# Patient Record
Sex: Male | Born: 1965 | Race: White | Hispanic: No | Marital: Single | Smoking: Former smoker
Health system: Southern US, Community
[De-identification: ages and names within clinical notes are randomized; demographics above are authoritative.]

## PROBLEM LIST (undated history)

## (undated) DIAGNOSIS — I10 Essential (primary) hypertension: Secondary | ICD-10-CM

## (undated) DIAGNOSIS — N200 Calculus of kidney: Secondary | ICD-10-CM

## (undated) DIAGNOSIS — K219 Gastro-esophageal reflux disease without esophagitis: Secondary | ICD-10-CM

## (undated) DIAGNOSIS — R911 Solitary pulmonary nodule: Secondary | ICD-10-CM

## (undated) HISTORY — DX: Essential (primary) hypertension: I10

## (undated) HISTORY — DX: Gastro-esophageal reflux disease without esophagitis: K21.9

---

## 2015-03-16 ENCOUNTER — Encounter (HOSPITAL_BASED_OUTPATIENT_CLINIC_OR_DEPARTMENT_OTHER): Payer: Self-pay | Admitting: *Deleted

## 2015-03-16 ENCOUNTER — Emergency Department (HOSPITAL_BASED_OUTPATIENT_CLINIC_OR_DEPARTMENT_OTHER): Payer: No Typology Code available for payment source

## 2015-03-16 ENCOUNTER — Emergency Department (HOSPITAL_BASED_OUTPATIENT_CLINIC_OR_DEPARTMENT_OTHER)
Admission: EM | Admit: 2015-03-16 | Discharge: 2015-03-16 | Disposition: A | Discharge status: Home / Self Care | Payer: No Typology Code available for payment source | Attending: Emergency Medicine | Admitting: Emergency Medicine

## 2015-03-16 DIAGNOSIS — R109 Unspecified abdominal pain: Secondary | ICD-10-CM | POA: Diagnosis present

## 2015-03-16 DIAGNOSIS — Z87442 Personal history of urinary calculi: Secondary | ICD-10-CM | POA: Insufficient documentation

## 2015-03-16 DIAGNOSIS — Z87891 Personal history of nicotine dependence: Secondary | ICD-10-CM | POA: Insufficient documentation

## 2015-03-16 LAB — URINE MICROSCOPIC-ADD ON: WBC, UA: NONE SEEN WBC/hpf (ref 0–5)

## 2015-03-16 LAB — CBC WITH DIFFERENTIAL/PLATELET
BASOS ABS: 0 10*3/uL (ref 0.0–0.1)
Basophils Relative: 0 %
EOS ABS: 0.1 10*3/uL (ref 0.0–0.7)
EOS PCT: 1 %
HCT: 45.9 % (ref 39.0–52.0)
Hemoglobin: 15.2 g/dL (ref 13.0–17.0)
LYMPHS ABS: 2.1 10*3/uL (ref 0.7–4.0)
LYMPHS PCT: 25 %
MCH: 28.5 pg (ref 26.0–34.0)
MCHC: 33.1 g/dL (ref 30.0–36.0)
MCV: 86 fL (ref 78.0–100.0)
MONO ABS: 0.8 10*3/uL (ref 0.1–1.0)
Monocytes Relative: 9 %
Neutro Abs: 5.4 10*3/uL (ref 1.7–7.7)
Neutrophils Relative %: 65 %
PLATELETS: 226 10*3/uL (ref 150–400)
RBC: 5.34 MIL/uL (ref 4.22–5.81)
RDW: 13.1 % (ref 11.5–15.5)
WBC: 8.5 10*3/uL (ref 4.0–10.5)

## 2015-03-16 LAB — BASIC METABOLIC PANEL
Anion gap: 6 (ref 5–15)
BUN: 15 mg/dL (ref 6–20)
CO2: 25 mmol/L (ref 22–32)
CREATININE: 0.9 mg/dL (ref 0.61–1.24)
Calcium: 8.8 mg/dL — ABNORMAL LOW (ref 8.9–10.3)
Chloride: 110 mmol/L (ref 101–111)
GFR calc Af Amer: >60 mL/min (ref >60)
GLUCOSE: 111 mg/dL — AB (ref 65–99)
POTASSIUM: 4.1 mmol/L (ref 3.5–5.1)
SODIUM: 141 mmol/L (ref 135–145)

## 2015-03-16 LAB — URINALYSIS, ROUTINE W REFLEX MICROSCOPIC
Bilirubin Urine: NEGATIVE
GLUCOSE, UA: NEGATIVE mg/dL
KETONES UR: NEGATIVE mg/dL
Leukocytes, UA: NEGATIVE
Nitrite: NEGATIVE
PROTEIN: NEGATIVE mg/dL
Specific Gravity, Urine: 1.021 (ref 1.005–1.030)
pH: 6.5 (ref 5.0–8.0)

## 2015-03-16 MED ORDER — HYDROCODONE-ACETAMINOPHEN 5-325 MG PO TABS: 1.0000 | ORAL_TABLET | Freq: Four times a day (QID) | ORAL | Status: DC | PRN

## 2015-03-16 MED ORDER — KETOROLAC TROMETHAMINE 30 MG/ML IJ SOLN
30.0000 mg | Freq: Once | INTRAMUSCULAR | Status: AC
  Administered 2015-03-16: 30 mg via INTRAVENOUS
  Filled 2015-03-16: qty 1

## 2015-03-16 MED ORDER — NAPROXEN 500 MG PO TABS: 500.0000 mg | ORAL_TABLET | Freq: Two times a day (BID) | ORAL | Status: DC

## 2015-03-16 NOTE — Discharge Instructions (Signed)
Ibuprofen 600 mg 3 times daily for the next 5 days.  Hydrocodone as prescribed as needed for pain not relieved with ibuprofen.  Return to the emergency department if symptoms significantly worsen or change.   Flank Pain Flank pain refers to pain that is located on the side of the body between the upper abdomen and the back. The pain may occur over a short period of time (acute) or may be long-term or reoccurring (chronic). It may be mild or severe. Flank pain can be caused by many things. CAUSES  Some of the more common causes of flank pain include:  Muscle strains.   Muscle spasms.   A disease of your spine (vertebral disk disease).   A lung infection (pneumonia).   Fluid around your lungs (pulmonary edema).   A kidney infection.   Kidney stones.   A very painful skin rash caused by the chickenpox virus (shingles).   Gallbladder disease.  HOME CARE INSTRUCTIONS  Home care will depend on the cause of your pain. In general,  Rest as directed by your caregiver.  Drink enough fluids to keep your urine clear or pale yellow.  Only take over-the-counter or prescription medicines as directed by your caregiver. Some medicines may help relieve the pain.  Tell your caregiver about any changes in your pain.  Follow up with your caregiver as directed. SEEK IMMEDIATE MEDICAL CARE IF:   Your pain is not controlled with medicine.   You have new or worsening symptoms.  Your pain increases.   You have abdominal pain.   You have shortness of breath.   You have persistent nausea or vomiting.   You have swelling in your abdomen.   You feel faint or pass out.   You have blood in your urine.  You have a fever or persistent symptoms for more than 2-3 days.  You have a fever and your symptoms suddenly get worse. MAKE SURE YOU:   Understand these instructions.  Will watch your condition.  Will get help right away if you are not doing well or get worse.     This information is not intended to replace advice given to you by your health care provider. Make sure you discuss any questions you have with your health care provider.   Document Released: 05/18/2005 Document Revised: 12/20/2011 Document Reviewed: 11/09/2011 Elsevier Interactive Patient Education [employer]2016 Elsevier Inc.

## 2015-03-16 NOTE — ED Notes (Signed)
Patient became verbally upset while reviewing discharge instructions because he felt like we had done nothing to help him. I again reviewed his results per doctor caring for patient had written them, explained need for medication and rationalized how ibuprofen was a good antiinflammatory, which is what patient was requesting. Pt verbally stated that he needed a stronger antiinflammatory for "knot" he stated you could feel in his muscle. Dr. Judd Lienelo went back into room and discussed with patient his test results again and gave him an additional prescription for naproxen. Pt still expressed his discontent for care provided and refused to sign stating I had reviewed his paperwork. I personally walked patient to discharge desk and again try to rationalize with him that what we were doing and what scripts we gave him, were what he was asking for.

## 2015-03-16 NOTE — ED Provider Notes (Signed)
CSN: [id number]     Arrival date & time 03/16/15  [id number] History   First MD Initiated Contact with Patient 03/16/15 1015     Chief Complaint  Patient presents with  . Flank Pain     (Consider location/radiation/quality/duration/timing/severity/associated sxs/prior Treatment) HPI Comments: Patient is a 49 year old male with no significant past medical history. He presents with a complaint of left flank pain that started yesterday and is gradually worsening. The pain radiates to the left lower abdomen and testicle. He denies any dysuria or frequency. He denies any blood in his urine. He denies any fevers or chills. He reports having a kidney stone nearly 20 years ago and does not recall whether this feels the same.  Patient is a 49 y.o. male presenting with flank pain. The history is provided by the patient.  Flank Pain This is a new problem. The current episode started yesterday. The problem occurs constantly. The problem has been gradually worsening. Associated symptoms include abdominal pain. Nothing aggravates the symptoms. Nothing relieves the symptoms. He has tried nothing for the symptoms. The treatment provided no relief.    History reviewed. No pertinent past medical history. History reviewed. No pertinent past surgical history. No family history on file. Social History  Substance Use Topics  . Smoking status: Former Games developermoker  . Smokeless tobacco: None  . Alcohol Use: None    Review of Systems  Gastrointestinal: Positive for abdominal pain.  Genitourinary: Positive for flank pain.  All other systems reviewed and are negative.     Allergies  Review of patient's allergies indicates no known allergies.  Home Medications   Prior to Admission medications   Not on File   BP 146/79 mmHg  Pulse 74  Temp(Src) 98.2 F (36.8 C) (Oral)  Resp 16  Ht 6\' 1"  (1.854 m)  Wt 200 lb (90.719 kg)  BMI 26.39 kg/m2  SpO2 100% Physical Exam  Constitutional: He is oriented to person,  place, and time. He appears well-developed and well-nourished. No distress.  HENT:  Head: Normocephalic and atraumatic.  Neck: Normal range of motion. Neck supple.  Cardiovascular: Normal rate, regular rhythm and normal heart sounds.   No murmur heard. Pulmonary/Chest: Effort normal and breath sounds normal. No respiratory distress. He has no wheezes.  Abdominal: Soft. Bowel sounds are normal. He exhibits no distension. There is tenderness. There is no rebound and no guarding.  There is mild CVA tenderness.  Musculoskeletal: Normal range of motion. He exhibits no edema.  Neurological: He is alert and oriented to person, place, and time.  Skin: Skin is warm and dry. He is not diaphoretic.  Nursing note and vitals reviewed.   ED Course  Procedures (including critical care time) Labs Review Labs Reviewed - No data to display  Imaging Review No results found. I have personally reviewed and evaluated these images and lab results as part of my medical decision-making.   EKG Interpretation None      MDM   Final diagnoses:  None    Patient presents with complaints of left flank pain. His symptoms and exam are most consistent with a kidney stone, however the CT scan does not reveal this. His urinalysis shows no evidence for infection. There is also no other alternate diagnosis suggested by his imaging study. I am assuming his discomfort is musculoskeletal in nature. He will be discharged with an anti-inflammatory, pain medication, and when necessary return.    Geoffery Lyonsouglas Larken Urias, MD 03/16/15 [phone]

## 2015-03-16 NOTE — ED Notes (Signed)
Pt given specimen cup with instructions for cc urine sample. Will provide sample asap.

## 2015-03-16 NOTE — ED Notes (Signed)
Patient transported to CT 

## 2015-03-16 NOTE — ED Notes (Signed)
Pt amb to room 3 with quick steady gait in nad. Pt reports left flank pain x Friday, intermittent, denies any injury or trauma, some relief with heat, also reports frequency.

## 2015-09-07 ENCOUNTER — Emergency Department (HOSPITAL_COMMUNITY)
Admission: EM | Admit: 2015-09-07 | Discharge: 2015-09-07 | Disposition: A | Discharge status: Home / Self Care | Payer: PRIVATE HEALTH INSURANCE | Attending: Emergency Medicine | Admitting: Emergency Medicine

## 2015-09-07 ENCOUNTER — Encounter (HOSPITAL_COMMUNITY): Payer: Self-pay | Admitting: Emergency Medicine

## 2015-09-07 ENCOUNTER — Emergency Department (HOSPITAL_COMMUNITY): Payer: PRIVATE HEALTH INSURANCE

## 2015-09-07 DIAGNOSIS — Y999 Unspecified external cause status: Secondary | ICD-10-CM | POA: Diagnosis not present

## 2015-09-07 DIAGNOSIS — R110 Nausea: Secondary | ICD-10-CM | POA: Diagnosis not present

## 2015-09-07 DIAGNOSIS — S80862A Insect bite (nonvenomous), left lower leg, initial encounter: Secondary | ICD-10-CM | POA: Diagnosis not present

## 2015-09-07 DIAGNOSIS — R109 Unspecified abdominal pain: Secondary | ICD-10-CM | POA: Diagnosis not present

## 2015-09-07 DIAGNOSIS — W57XXXA Bitten or stung by nonvenomous insect and other nonvenomous arthropods, initial encounter: Secondary | ICD-10-CM | POA: Diagnosis not present

## 2015-09-07 DIAGNOSIS — Z87891 Personal history of nicotine dependence: Secondary | ICD-10-CM | POA: Insufficient documentation

## 2015-09-07 DIAGNOSIS — R350 Frequency of micturition: Secondary | ICD-10-CM | POA: Diagnosis not present

## 2015-09-07 DIAGNOSIS — R531 Weakness: Secondary | ICD-10-CM | POA: Diagnosis present

## 2015-09-07 DIAGNOSIS — R911 Solitary pulmonary nodule: Secondary | ICD-10-CM | POA: Diagnosis not present

## 2015-09-07 DIAGNOSIS — Y939 Activity, unspecified: Secondary | ICD-10-CM | POA: Insufficient documentation

## 2015-09-07 DIAGNOSIS — Y929 Unspecified place or not applicable: Secondary | ICD-10-CM | POA: Diagnosis not present

## 2015-09-07 LAB — URINALYSIS, ROUTINE W REFLEX MICROSCOPIC
Bilirubin Urine: NEGATIVE
GLUCOSE, UA: NEGATIVE mg/dL
KETONES UR: NEGATIVE mg/dL
LEUKOCYTES UA: NEGATIVE
NITRITE: NEGATIVE
PROTEIN: NEGATIVE mg/dL
Specific Gravity, Urine: 1.015 (ref 1.005–1.030)
pH: 6 (ref 5.0–8.0)

## 2015-09-07 LAB — CBC WITH DIFFERENTIAL/PLATELET
BASOS ABS: 0 10*3/uL (ref 0.0–0.1)
BASOS PCT: 0 %
Eosinophils Absolute: 0 10*3/uL (ref 0.0–0.7)
Eosinophils Relative: 0 %
HEMATOCRIT: 47.6 % (ref 39.0–52.0)
Hemoglobin: 15.7 g/dL (ref 13.0–17.0)
Lymphocytes Relative: 9 %
Lymphs Abs: 1.3 10*3/uL (ref 0.7–4.0)
MCH: 29 pg (ref 26.0–34.0)
MCHC: 33 g/dL (ref 30.0–36.0)
MCV: 88 fL (ref 78.0–100.0)
MONO ABS: 1.2 10*3/uL — AB (ref 0.1–1.0)
Monocytes Relative: 8 %
NEUTROS ABS: 12.2 10*3/uL — AB (ref 1.7–7.7)
Neutrophils Relative %: 83 %
PLATELETS: 187 10*3/uL (ref 150–400)
RBC: 5.41 MIL/uL (ref 4.22–5.81)
RDW: 13.8 % (ref 11.5–15.5)
WBC: 14.7 10*3/uL — AB (ref 4.0–10.5)

## 2015-09-07 LAB — BASIC METABOLIC PANEL
ANION GAP: 9 (ref 5–15)
BUN: 12 mg/dL (ref 6–20)
CALCIUM: 8.6 mg/dL — AB (ref 8.9–10.3)
CO2: 25 mmol/L (ref 22–32)
Chloride: 102 mmol/L (ref 101–111)
Creatinine, Ser: 0.89 mg/dL (ref 0.61–1.24)
Glucose, Bld: 107 mg/dL — ABNORMAL HIGH (ref 65–99)
Potassium: 3.7 mmol/L (ref 3.5–5.1)
Sodium: 136 mmol/L (ref 135–145)

## 2015-09-07 LAB — LIPASE, BLOOD: Lipase: 21 U/L (ref 11–51)

## 2015-09-07 LAB — URINE MICROSCOPIC-ADD ON
SQUAMOUS EPITHELIAL / LPF: NONE SEEN
WBC UA: NONE SEEN WBC/hpf (ref 0–5)

## 2015-09-07 MED ORDER — DOXYCYCLINE HYCLATE 100 MG PO CAPS: 100.0000 mg | ORAL_CAPSULE | Freq: Two times a day (BID) | ORAL | Status: AC

## 2015-09-07 MED ORDER — SODIUM CHLORIDE 0.9 % IV BOLUS (SEPSIS)
1000.0000 mL | Freq: Once | INTRAVENOUS | Status: AC
  Administered 2015-09-07: 1000 mL via INTRAVENOUS

## 2015-09-07 MED ORDER — ONDANSETRON 4 MG PO TBDP: ORAL_TABLET | ORAL | Status: DC

## 2015-09-07 MED ORDER — CIPROFLOXACIN HCL 500 MG PO TABS: 500.0000 mg | ORAL_TABLET | Freq: Two times a day (BID) | ORAL | Status: DC

## 2015-09-07 MED ORDER — ONDANSETRON HCL 4 MG/2ML IJ SOLN
4.0000 mg | Freq: Once | INTRAMUSCULAR | Status: AC
  Administered 2015-09-07: 4 mg via INTRAVENOUS
  Filled 2015-09-07: qty 2

## 2015-09-07 NOTE — Discharge Instructions (Signed)
Take Zofran as needed for nausea and vomiting. Follow-up with a local doctor for reassessment within a week. He will need a repeat CT scan of your chest in approximately 6-8 months to follow your lung nodule.  If you were given medicines take as directed.  If you are on coumadin or contraceptives realize their levels and effectiveness is altered by many different medicines.  If you have any reaction (rash, tongues swelling, other) to the medicines stop taking and see a physician.    If your blood pressure was elevated in the ER make sure you follow up for management with a primary doctor or return for chest pain, shortness of breath or stroke symptoms.  Please follow up as directed and return to the ER or see a physician for new or worsening symptoms.  Thank you. Filed Vitals:   09/07/15 0731  BP: 127/77  Pulse: 89  Temp: 98.2 F (36.8 C)  Resp: 18  Height: 6\' 1"  (1.854 m)  Weight: 188 lb (85.276 kg)  SpO2: 97%

## 2015-09-07 NOTE — ED Notes (Signed)
Pt c/o nausea, increased urination, LT lower back pain, body aches, diaphoresis and generalized weakness x 1 day.  Pt states he pulled a tick off of him a couple of days ago, but does not think it bit him.

## 2015-09-07 NOTE — ED Provider Notes (Addendum)
CSN: [id number]     Arrival date & time 09/07/15  0724 History   First MD Initiated Contact with Patient 09/07/15 [phone]     Chief Complaint  Patient presents with  . Illness     (Consider location/radiation/quality/duration/timing/severity/associated sxs/prior Treatment) HPI Comments: 50 year old male with history of kidney stone years ago, past smoker presents with left lower back ache, body aches, general weakness and increased urination for the past day. Patient unsure if this feels similar to kidney stone history. No history of diverticulitis. No significant abdominal discomfort mostly nausea. Patient did pull a tick off him couple days ago his pressure was not on there longer than a day. No rash around the site. No fevers. Patient did drink heavy alcohol on Sunday.  Patient is a 50 y.o. male presenting with general illness. The history is provided by the patient.  Illness Associated symptoms: fatigue and nausea   Associated symptoms: no abdominal pain, no chest pain, no congestion, no fever, no headaches, no rash, no shortness of breath and no vomiting     History reviewed. No pertinent past medical history. History reviewed. No pertinent past surgical history. No family history on file. Social History  Substance Use Topics  . Smoking status: Former Smoker    Types: Cigarettes  . Smokeless tobacco: None  . Alcohol Use: Yes     Comment: occ    Review of Systems  Constitutional: Positive for diaphoresis, appetite change and fatigue. Negative for fever and chills.  HENT: Negative for congestion.   Eyes: Negative for visual disturbance.  Respiratory: Negative for shortness of breath.   Cardiovascular: Negative for chest pain.  Gastrointestinal: Positive for nausea. Negative for vomiting and abdominal pain.  Genitourinary: Positive for flank pain. Negative for dysuria.  Musculoskeletal: Negative for back pain, neck pain and neck stiffness.  Skin: Negative for rash.   Neurological: Negative for light-headedness and headaches.      Allergies  Review of patient's allergies indicates no known allergies.  Home Medications   Prior to Admission medications   Medication Sig Start Date End Date Taking? Authorizing Provider  ciprofloxacin (CIPRO) 500 MG tablet Take 1 tablet (500 mg total) by mouth 2 (two) times daily. One po bid x 7 days 09/07/15   Blane Ohara, MD  doxycycline (VIBRAMYCIN) 100 MG capsule Take 1 capsule (100 mg total) by mouth 2 (two) times daily. 09/07/15 09/16/15  Blane Ohara, MD  HYDROcodone-acetaminophen (NORCO) 5-325 MG tablet Take 1-2 tablets by mouth every 6 (six) hours as needed. 03/16/15   Geoffery Lyons, MD  naproxen (NAPROSYN) 500 MG tablet Take 1 tablet (500 mg total) by mouth 2 (two) times daily. 03/16/15   Geoffery Lyons, MD  ondansetron (ZOFRAN ODT) 4 MG disintegrating tablet  ODT q4 hours prn nausea/vomit 09/07/15   Blane Ohara, MD   BP 127/77 mmHg  Pulse 89  Temp(Src) 98.2 F (36.8 C)  Resp 18  Ht  (1.854 m)  Wt 188 lb (85.276 kg)  BMI 24.81 kg/m2  SpO2 97% Physical Exam  Constitutional: He is oriented to person, place, and time. He appears well-developed and well-nourished.  HENT:  Head: Normocephalic and atraumatic.  Eyes: Conjunctivae are normal. Right eye exhibits no discharge. Left eye exhibits no discharge.  Neck: Normal range of motion. Neck supple. No tracheal deviation present.  Cardiovascular: Normal rate and regular rhythm.   Pulmonary/Chest: Effort normal and breath sounds normal.  Abdominal: Soft. He exhibits no distension. There is tenderness (mild left lower flank no midline  lumbar tenderness). There is no guarding.  Musculoskeletal: He exhibits no edema.  Neurological: He is alert and oriented to person, place, and time.  Skin: Skin is warm. No rash noted.  No rash or infection signs at site of tick removal anterior left tibia  Psychiatric: He has a normal mood and affect.  Nursing note and  vitals reviewed.   ED Course  Procedures (including critical care time) Labs Review Labs Reviewed  URINALYSIS, ROUTINE W REFLEX MICROSCOPIC (NOT AT [facility]) - Abnormal; Notable for the following:    Hgb urine dipstick SMALL (*)    All other components within normal limits  CBC WITH DIFFERENTIAL/PLATELET - Abnormal; Notable for the following:    WBC 14.7 (*)    Neutro Abs 12.2 (*)    Monocytes Absolute 1.2 (*)    All other components within normal limits  BASIC METABOLIC PANEL - Abnormal; Notable for the following:    Glucose, Bld 107 (*)    Calcium 8.6 (*)    All other components within normal limits  URINE MICROSCOPIC-ADD ON - Abnormal; Notable for the following:    Bacteria, UA RARE (*)    All other components within normal limits  LIPASE, BLOOD    Imaging Review Ct Renal Stone Study  09/07/2015  CLINICAL DATA:  Left flank pain. EXAM: CT ABDOMEN AND PELVIS WITHOUT CONTRAST TECHNIQUE: Multidetector CT imaging of the abdomen and pelvis was performed following the standard protocol without IV contrast. COMPARISON:  CT scan of March 16, 2015. FINDINGS: 7 mm nodule is noted in left lateral costophrenic sulcus. No significant osseous abnormality is noted. No gallstones are noted. Stable right hepatic cyst is noted. The spleen and pancreas are unremarkable. Adrenal glands appear normal. Stable nonobstructive calculus is seen in lower pole collecting system of left kidney. No hydronephrosis or renal obstruction is noted. No ureteral calculi are noted. The appendix appears normal. There is no evidence of bowel obstruction. No abnormal fluid collection is noted. Urinary bladder appears normal. 2 cm exophytic cyst is seen arising from lower pole of left kidney which is stable compared to prior exam. No significant adenopathy is noted. IMPRESSION: Stable nonobstructive left renal calculus. No hydronephrosis or renal obstruction is noted. 7 mm nodule is noted in left lower lobe. Non-contrast chest CT  at 6-12 months is recommended. If the nodule is stable at time of repeat CT, then future CT at 18-24 months (from today's scan) is considered optional for low-risk patients, but is recommended for high-risk patients. This recommendation follows the consensus statement: Guidelines for Management of Incidental Pulmonary Nodules Detected on CT Images:From the Fleischner Society 2017; published online before print (10.1148/[id number]). Electronically Signed   By: Lupita RaiderJames  Green Jr, M.D.   On: 09/07/2015 08:49   I have personally reviewed and evaluated these images and lab results as part of my medical decision-making.   EKG Interpretation None      MDM   Final diagnoses:  Nodule of left lung  Nausea  Tick bite  Urinary frequency   Patient presents with primarily nausea, frequency and left flank pain. Discussed differential diagnosis including urinary pathology/kidney stone. Discussed also may be related to alcohol use over the weekend. Unlikely tick related with tick only being on less than 2 days, no signs or rash no fever. Plan for IV fluids nausea meds, CT stone study to look for both kidney stone in possibly early diverticulitis.  Patient feels improved on recheck. Patient is having urinary frequency, elevated white count and feeling  generally unwell. Discussed concern for prostatitis. Patient refuses rectal exam this time however will start oral anabiotic treatment and follow-up with primary doctor. Discussed follow-up for lung nodule in detail. Results and differential diagnosis were discussed with the patient/parent/guardian. Xrays were independently reviewed by myself.  Close follow up outpatient was discussed, comfortable with the plan.   Medications  sodium chloride 0.9 % bolus 1,000 mL (1,000 mLs Intravenous New Bag/Given 09/07/15 0814)  ondansetron (ZOFRAN) injection 4 mg (4 mg Intravenous Given 09/07/15 0814)    Filed Vitals:   09/07/15 0731  BP: 127/77  Pulse: 89  Temp:  98.2 F (36.8 C)  Resp: 18  Height: 6\' 1"  (1.854 m)  Weight: 188 lb (85.276 kg)  SpO2: 97%    Final diagnoses:  Nodule of left lung  Nausea  Tick bite  Urinary frequency          Blane Ohara, MD 09/07/15 4540  Blane Ohara, MD 09/07/15 [phone]

## 2016-09-17 ENCOUNTER — Emergency Department (HOSPITAL_COMMUNITY)
Admission: EM | Admit: 2016-09-17 | Discharge: 2016-09-17 | Disposition: A | Discharge status: Home / Self Care | Payer: PRIVATE HEALTH INSURANCE | Attending: Emergency Medicine | Admitting: Emergency Medicine

## 2016-09-17 ENCOUNTER — Encounter (HOSPITAL_COMMUNITY): Payer: Self-pay | Admitting: *Deleted

## 2016-09-17 DIAGNOSIS — Z79899 Other long term (current) drug therapy: Secondary | ICD-10-CM | POA: Insufficient documentation

## 2016-09-17 DIAGNOSIS — M545 Low back pain: Secondary | ICD-10-CM | POA: Diagnosis not present

## 2016-09-17 DIAGNOSIS — Z87891 Personal history of nicotine dependence: Secondary | ICD-10-CM | POA: Insufficient documentation

## 2016-09-17 DIAGNOSIS — R1032 Left lower quadrant pain: Secondary | ICD-10-CM | POA: Diagnosis not present

## 2016-09-17 DIAGNOSIS — R109 Unspecified abdominal pain: Secondary | ICD-10-CM

## 2016-09-17 LAB — URINALYSIS, ROUTINE W REFLEX MICROSCOPIC
Bilirubin Urine: NEGATIVE
GLUCOSE, UA: NEGATIVE mg/dL
HGB URINE DIPSTICK: NEGATIVE
Ketones, ur: NEGATIVE mg/dL
Leukocytes, UA: NEGATIVE
Nitrite: NEGATIVE
Protein, ur: NEGATIVE mg/dL
SPECIFIC GRAVITY, URINE: 1.024 (ref 1.005–1.030)
pH: 5 (ref 5.0–8.0)

## 2016-09-17 MED ORDER — KETOROLAC TROMETHAMINE 60 MG/2ML IM SOLN
30.0000 mg | Freq: Once | INTRAMUSCULAR | Status: AC
  Administered 2016-09-17: 30 mg via INTRAMUSCULAR
  Filled 2016-09-17: qty 2

## 2016-09-17 MED ORDER — CYCLOBENZAPRINE HCL 10 MG PO TABS: 10.0000 mg | ORAL_TABLET | Freq: Two times a day (BID) | ORAL | 0 refills | Status: DC | PRN

## 2016-09-17 MED ORDER — CYCLOBENZAPRINE HCL 10 MG PO TABS
10.0000 mg | ORAL_TABLET | Freq: Once | ORAL | Status: AC
  Administered 2016-09-17: 10 mg via ORAL
  Filled 2016-09-17: qty 1

## 2016-09-17 MED ORDER — HYDROCODONE-ACETAMINOPHEN 5-325 MG PO TABS
1.0000 | ORAL_TABLET | Freq: Once | ORAL | Status: AC
Start: 2016-09-17 — End: 2016-09-17
  Administered 2016-09-17: 1 via ORAL
  Filled 2016-09-17: qty 1

## 2016-09-17 MED ORDER — IBUPROFEN 400 MG PO TABS: 400.0000 mg | ORAL_TABLET | Freq: Four times a day (QID) | ORAL | 0 refills | Status: DC

## 2016-09-17 MED ORDER — HYDROCODONE-ACETAMINOPHEN 5-325 MG PO TABS: 1.0000 | ORAL_TABLET | Freq: Two times a day (BID) | ORAL | 0 refills | Status: DC | PRN

## 2016-09-17 NOTE — ED Provider Notes (Signed)
[facility]-EMERGENCY DEPT Provider Note   CSN: [id number] Arrival date & time: 09/17/16  1011     History   Chief Complaint Chief Complaint  Patient presents with  . Flank Pain    HPI Cory Gardner is a 51 y.o. male.   Back Pain   This is a recurrent problem. The current episode started more than 2 days ago. The problem occurs daily. The problem has not changed since onset.The pain is associated with no known injury. The quality of the pain is described as aching. Radiates to: left lateral abdomen. The pain is mild. Pertinent negatives include no chest pain, no fever, no abdominal pain, no bowel incontinence, no perianal numbness, no bladder incontinence, no dysuria, no pelvic pain and no leg pain. He has tried heat for the symptoms. The treatment provided mild relief.    History reviewed. No pertinent past medical history.  There are no active problems to display for this patient.   History reviewed. No pertinent surgical history.     Home Medications    Prior to Admission medications   Medication Sig Start Date End Date Taking? Authorizing Provider  sulfamethoxazole-trimethoprim (BACTRIM DS,SEPTRA DS) 800-160 MG tablet Take 1 tablet by mouth 2 (two) times daily.   Yes [provider]  cyclobenzaprine (FLEXERIL) 10 MG tablet Take 1 tablet (10 mg total) by mouth 2 (two) times daily as needed for muscle spasms. 09/17/16   Cas Tracz, Barbara CowerJason, MD  HYDROcodone-acetaminophen (NORCO/VICODIN) 5-325 MG tablet Take 1 tablet by mouth every 12 (twelve) hours as needed. 09/17/16   Josette Shimabukuro, Barbara CowerJason, MD  ibuprofen (ADVIL,MOTRIN) 400 MG tablet Take 1 tablet (400 mg total) by mouth 4 (four) times daily. 09/17/16   Able Malloy, Barbara CowerJason, MD    Family History No family history on file.  Social History Social History  Substance Use Topics  . Smoking status: Former Smoker    Types: Cigarettes  . Smokeless tobacco: Never Used  . Alcohol use Yes     Comment: occ     Allergies   Patient has no  known allergies.   Review of Systems Review of Systems  Constitutional: Negative for fever.  Cardiovascular: Negative for chest pain.  Gastrointestinal: Negative for abdominal pain and bowel incontinence.  Genitourinary: Negative for bladder incontinence, dysuria and pelvic pain.  Musculoskeletal: Positive for back pain.  All other systems reviewed and are negative.    Physical Exam Updated Vital Signs BP 118/69   Pulse 66   Temp 97.9 F (36.6 C) (Oral)   Resp 18   Ht 6\' 1"  (1.854 m)   Wt 90.7 kg (200 lb)   SpO2 96%   BMI 26.39 kg/m   Physical Exam  Constitutional: He is oriented to person, place, and time. He appears well-developed and well-nourished.  HENT:  Head: Normocephalic and atraumatic.  Eyes: Conjunctivae and EOM are normal.  Neck: Normal range of motion.  Cardiovascular: Normal rate.   Pulmonary/Chest: Effort normal. No respiratory distress.  Abdominal: He exhibits no distension.  Musculoskeletal: Normal range of motion. He exhibits tenderness (left lower back with back spasm).  Neurological: He is alert and oriented to person, place, and time.  Nursing note and vitals reviewed.    ED Treatments / Results  Labs (all labs ordered are listed, but only abnormal results are displayed) Labs Reviewed  URINALYSIS, ROUTINE W REFLEX MICROSCOPIC    EKG  EKG Interpretation None       Radiology No results found.  Procedures Procedures (including critical care time)  Medications  Ordered in ED Medications  cyclobenzaprine (FLEXERIL) tablet 10 mg (10 mg Oral Given 09/17/16 1059)  HYDROcodone-acetaminophen (NORCO/VICODIN) 5-325 MG per tablet 1 tablet (1 tablet Oral Given 09/17/16 1059)  ketorolac (TORADOL) injection 30 mg (30 mg Intramuscular Given 09/17/16 1059)     Initial Impression / Assessment and Plan / ED Course  I have reviewed the triage vital signs and the nursing notes.  Pertinent labs & imaging results that were available during my care of  the patient were reviewed by me and considered in my medical decision making (see chart for details).     Likely MSK. Without blood, kidney stone unlikely and will forego CT at this time. Will treat smptomatically for MSK pain with return precautions.   Final Clinical Impressions(s) / ED Diagnoses   Final diagnoses:  Left flank pain    New Prescriptions Discharge Medication List as of 09/17/2016 11:56 AM    START taking these medications   Details  cyclobenzaprine (FLEXERIL) 10 MG tablet Take 1 tablet (10 mg total) by mouth 2 (two) times daily as needed for muscle spasms., Starting Sun 09/17/2016, Print    HYDROcodone-acetaminophen (NORCO/VICODIN) 5-325 MG tablet Take 1 tablet by mouth every 12 (twelve) hours as needed., Starting Sun 09/17/2016, Print    ibuprofen (ADVIL,MOTRIN) 400 MG tablet Take 1 tablet (400 mg total) by mouth 4 (four) times daily., Starting Sun 09/17/2016, Print         Gwenda Heiner, Barbara Cower, MD 09/18/16 [phone]

## 2016-09-17 NOTE — ED Triage Notes (Signed)
Pt c/o left flank pain that radiates around to LLQ that started 1 week ago. Denies dysuria, hematuria, fever. Pt was seen at Urgent Care on Thursday and given Bactrim. Pt has been taking Bactrim for 2 days with no relief of pain.

## 2016-12-06 ENCOUNTER — Emergency Department
Admission: EM | Admit: 2016-12-06 | Discharge: 2016-12-06 | Disposition: A | Discharge status: Home / Self Care | Payer: PRIVATE HEALTH INSURANCE | Attending: Emergency Medicine | Admitting: Emergency Medicine

## 2016-12-06 ENCOUNTER — Emergency Department: Payer: PRIVATE HEALTH INSURANCE

## 2016-12-06 ENCOUNTER — Encounter: Payer: Self-pay | Admitting: Emergency Medicine

## 2016-12-06 DIAGNOSIS — Z87891 Personal history of nicotine dependence: Secondary | ICD-10-CM | POA: Insufficient documentation

## 2016-12-06 DIAGNOSIS — R112 Nausea with vomiting, unspecified: Secondary | ICD-10-CM | POA: Diagnosis not present

## 2016-12-06 DIAGNOSIS — R200 Anesthesia of skin: Secondary | ICD-10-CM | POA: Diagnosis not present

## 2016-12-06 DIAGNOSIS — R42 Dizziness and giddiness: Secondary | ICD-10-CM | POA: Insufficient documentation

## 2016-12-06 DIAGNOSIS — R531 Weakness: Secondary | ICD-10-CM | POA: Diagnosis present

## 2016-12-06 HISTORY — DX: Solitary pulmonary nodule: R91.1

## 2016-12-06 LAB — BASIC METABOLIC PANEL
ANION GAP: 8 (ref 5–15)
BUN: 17 mg/dL (ref 6–20)
CHLORIDE: 106 mmol/L (ref 101–111)
CO2: 24 mmol/L (ref 22–32)
Calcium: 9 mg/dL (ref 8.9–10.3)
Creatinine, Ser: 0.95 mg/dL (ref 0.61–1.24)
GFR calc non Af Amer: >60 mL/min (ref >60)
Glucose, Bld: 99 mg/dL (ref 65–99)
Potassium: 3.9 mmol/L (ref 3.5–5.1)
SODIUM: 138 mmol/L (ref 135–145)

## 2016-12-06 LAB — URINALYSIS, COMPLETE (UACMP) WITH MICROSCOPIC
Bacteria, UA: NONE SEEN
Bilirubin Urine: NEGATIVE
GLUCOSE, UA: NEGATIVE mg/dL
Ketones, ur: NEGATIVE mg/dL
Leukocytes, UA: NEGATIVE
NITRITE: NEGATIVE
PH: 6 (ref 5.0–8.0)
Protein, ur: NEGATIVE mg/dL
Specific Gravity, Urine: 1.023 (ref 1.005–1.030)

## 2016-12-06 LAB — CBC
HCT: 47 % (ref 40.0–52.0)
HEMOGLOBIN: 15.8 g/dL (ref 13.0–18.0)
MCH: 29 pg (ref 26.0–34.0)
MCHC: 33.7 g/dL (ref 32.0–36.0)
MCV: 86.1 fL (ref 80.0–100.0)
PLATELETS: 186 10*3/uL (ref 150–440)
RBC: 5.45 MIL/uL (ref 4.40–5.90)
RDW: 14 % (ref 11.5–14.5)
WBC: 7.3 10*3/uL (ref 3.8–10.6)

## 2016-12-06 LAB — HEPATIC FUNCTION PANEL
ALT: 28 U/L (ref 17–63)
AST: 30 U/L (ref 15–41)
Albumin: 4.1 g/dL (ref 3.5–5.0)
Alkaline Phosphatase: 69 U/L (ref 38–126)
BILIRUBIN TOTAL: 0.9 mg/dL (ref 0.3–1.2)
Total Protein: 7.7 g/dL (ref 6.5–8.1)

## 2016-12-06 LAB — TROPONIN I: Troponin I: <0.03 ng/mL (ref <0.03)

## 2016-12-06 LAB — LIPASE, BLOOD: Lipase: 28 U/L (ref 11–51)

## 2016-12-06 MED ORDER — ACETAMINOPHEN 500 MG PO TABS
1000.0000 mg | ORAL_TABLET | Freq: Once | ORAL | Status: AC
  Administered 2016-12-06: 1000 mg via ORAL
  Filled 2016-12-06: qty 2

## 2016-12-06 MED ORDER — ONDANSETRON HCL 4 MG/2ML IJ SOLN
INTRAMUSCULAR | Status: AC
  Administered 2016-12-06: 4 mg via INTRAVENOUS
  Filled 2016-12-06: qty 2

## 2016-12-06 MED ORDER — ONDANSETRON 4 MG PO TBDP: 4.0000 mg | ORAL_TABLET | Freq: Three times a day (TID) | ORAL | 0 refills | Status: DC | PRN

## 2016-12-06 MED ORDER — SODIUM CHLORIDE 0.9 % IV BOLUS (SEPSIS)
1000.0000 mL | Freq: Once | INTRAVENOUS | Status: AC
  Administered 2016-12-06: 1000 mL via INTRAVENOUS

## 2016-12-06 MED ORDER — ONDANSETRON HCL 4 MG/2ML IJ SOLN
4.0000 mg | Freq: Once | INTRAMUSCULAR | Status: AC
  Administered 2016-12-06: 4 mg via INTRAVENOUS

## 2016-12-06 NOTE — Discharge Instructions (Signed)
Please drink plenty of fluids to stay well hydrated. Take breaks at work to drink additional fluids and to get into [place].  Return to the emergency department if you develop severe pain, numbness tingling or weakness, changes in vision or speech, difficulty walking, inability to keep down fluids, lightheadedness or fainting, or any other symptoms concerning to you.

## 2016-12-06 NOTE — ED Notes (Signed)
Pt given water to drink, ok per Dr. Sharma CovertNorman   No distress noted at this time. Pt talking on cell phone. IVF still infusing.

## 2016-12-06 NOTE — ED Notes (Signed)
Pt states he thinks he is dehydrated. Works on cars, has been getting overheated from temperature and humidity. Also thinks he might have a virus. Pt states weakness. Dizziness this AM but not currently. Able to move all extremities freely. Alert and oriented. States slight nausea.

## 2016-12-06 NOTE — ED Provider Notes (Signed)
[facility] Emergency Department Provider Note  ____________________________________________  Time seen: Approximately 12:25 PM  I have reviewed the triage vital signs and the nursing notes.   HISTORY  Chief Complaint Weakness    HPI Cory Gardner is a 51 y.o. male , otherwise healthy, presenting w/ LUE and LLE numbness, lightheadedness w/ standing, vomiting and general malaise.  The pt reports that he was working in a hot enclosed environment for several hours yesterday, and developed some mild numbness throughout the entirety of the LUE and LLE w/o weakness or tingling, associated with postural lightheadedness, and general malaise.  He did have one episode of vomiting.  He had a small amount of nonproductive cough without congestion or rhinorrhea; no fevers or chills.He felt better last night but symptoms returned when he went back to work today.  He denies any associated chest pain, shortness of breath, palpitations. He has not had any recent illness.   Past Medical History:  Diagnosis Date  . Lung nodule     There are no active problems to display for this patient.   History reviewed. No pertinent surgical history.  Current Outpatient Rx  . Order #: [id number] Class: Print  . Order #: [id number] Class: Print  . Order #: [id number] Class: Print  . Order #: [id number] Class: Print    Allergies Patient has no known allergies.  No family history on file.  Social History Social History  Substance Use Topics  . Smoking status: Former Smoker    Types: Cigarettes  . Smokeless tobacco: Never Used  . Alcohol use Yes     Comment: occ    Review of Systems Constitutional: No fever/chills.positive lightheadedness with standing. Negative syncope. Eyes: No visual changes.o blurred or double vision. ENT: No sore throat. No congestion or rhinorrhea. Cardiovascular: Denies chest pain. Denies palpitations. Respiratory: Denies shortness of breath.  +  Nonproductive mild cough. Gastrointestinal: No abdominal pain.  + nausea, + vomiting.  No diarrhea.  No constipation. Genitourinary: Negative for dysuria. Musculoskeletal: Negative for back pain. Skin: Negative for rash. Neurological: Negative for headaches. + LUE and LLE numbness, now resolved.  No weakness.  No changes in vision or speech. No difficulty walking.    ____________________________________________   PHYSICAL EXAM:  VITAL SIGNS: ED Triage Vitals  Enc Vitals Group     BP 12/06/16 1150 (!) 146/85     Pulse Rate 12/06/16 1150 86     Resp 12/06/16 1150 20     Temp 12/06/16 1150 98.8 F (37.1 C)     Temp Source 12/06/16 1150 Oral     SpO2 12/06/16 1150 98 %     Weight 12/06/16 1151 184 lb (83.5 kg)     Height 12/06/16 1151 6\' 1"  (1.854 m)     Head Circumference --      Peak Flow --      Pain Score 12/06/16 1155 8     Pain Loc --      Pain Edu? --      Excl. in GC? --     Constitutional: Alert and oriented. Well appearing and in no acute distress. Answers questions appropriately. Eyes: Conjunctivae are normal.  EOMI. No scleral icterus. Head: Atraumatic. Nose: No congestion/rhinnorhea. Mouth/Throat: Mucous membranes are moist.  Neck: No stridor.  Supple.  No JVD. Cardiovascular: Normal rate, regular rhythm. No murmurs, rubs or gallops.  Respiratory: Normal respiratory effort.  No accessory muscle use or retractions. Lungs CTAB.  No wheezes, rales or ronchi. Gastrointestinal: Soft, nontender and nondistended.  No  guarding or rebound.  No peritoneal signs. Musculoskeletal: No LE edema. No ttp in the calves or palpable cords.  Negative Homan's sign. Neurologic: Alert and oriented 3. Speech is clear. Face and smile symmetric. Tongue is midline. EOMI. PERRLA.  No horizontal or vertical nystagmus. No pronator drift. 5 out of 5 grip, biceps, triceps, hip flexors, plantar flexion and dorsiflexion. Normal sensation to light touch in the bilateral upper and lower  extremities, and face. Normal gait without ataxia. Skin:  Skin is warm, dry and intact. No rash noted. Psychiatric: Mood and affect are normal. Speech and behavior are normal.  Normal judgement.  ____________________________________________   LABS (all labs ordered are listed, but only abnormal results are displayed)  Labs Reviewed  URINALYSIS, COMPLETE (UACMP) WITH MICROSCOPIC - Abnormal; Notable for the following:       Result Value   Color, Urine YELLOW (*)    APPearance CLEAR (*)    Hgb urine dipstick SMALL (*)    Squamous Epithelial / LPF 0-5 (*)    All other components within normal limits  HEPATIC FUNCTION PANEL - Abnormal; Notable for the following:    Bilirubin, Direct <0.1 (*)    All other components within normal limits  BASIC METABOLIC PANEL  CBC  LIPASE, BLOOD  TROPONIN I   ____________________________________________  EKG  ED ECG REPORT I, Rockne Menghini, the attending physician, personally viewed and interpreted this ECG.   Date: 12/06/2016  EKG Time: 1149  Rate: 80  Rhythm: normal sinus rhythm  Axis: leftward  Intervals:none  ST&T Change: No STEMI  ____________________________________________  RADIOLOGY  Dg Chest 2 View  Result Date: 12/06/2016 CLINICAL DATA:  Initial evaluation for acute weakness, dizziness. EXAM: CHEST  2 VIEW COMPARISON:  None. FINDINGS: The cardiac and mediastinal silhouettes are within normal limits. The lungs are normally inflated. No airspace consolidation, pleural effusion, or pulmonary edema is identified. There is no pneumothorax. No acute osseous abnormality identified. Remotely healed proximal right humeral fracture noted. IMPRESSION: No active cardiopulmonary disease. Electronically Signed   By: Rise Mu M.D.   On: 12/06/2016 13:00   Ct Head Wo Contrast  Result Date: 12/06/2016 CLINICAL DATA:  Dehydration, over heated from temperature and humidity, weakness, dizziness this morning, numbness and  tingling, paresthesias, former smoker EXAM: CT HEAD WITHOUT CONTRAST TECHNIQUE: Contiguous axial images were obtained from the base of the skull through the vertex without intravenous contrast. Sagittal and coronal MPR images reconstructed from axial data set. COMPARISON:  None FINDINGS: Brain: Few scattered motion artifacts, especially traversing temporal lobes. Normal ventricular morphology. No midline shift or mass effect. Otherwise grossly normal appearance of brain parenchyma. No intracranial hemorrhage, mass lesion, or evidence of acute infarction. No definite extra-axial fluid collections. Vascular: Unremarkable Skull: Within limits of motion, grossly normal appearance Sinuses/Orbits: Clear Other: N/A IMPRESSION: No definite intracranial abnormalities identified on exam mildly limited by patient motion as above. Electronically Signed   By: Ulyses Southward M.D.   On: 12/06/2016 13:05    ____________________________________________   PROCEDURES  Procedure(s) performed: None  Procedures  Critical Care performed: No ____________________________________________   INITIAL IMPRESSION / ASSESSMENT AND PLAN / ED COURSE  Pertinent labs & imaging results that were available during my care of the patient were reviewed by me and considered in my medical decision making (see chart for details).  51 y.o. Male presenting with heat associated left upper and left lower extremity weakness, postural lightheadedness,cough and one episode of nausea and vomiting that started yesterday. Overall, the patient is  hemodynamically stable. We watics to see if this may be due to dehydration. He has no evidence of acute infection, and there are no physical exam findings OB suggestive of acute CVA. He has not been having chest pains or ACS or MI is unlikely and his EKG does not show arrhythmia. We will get basic laboratory studies to evaluate for anemia, electrolyte dysfunction We'll also get a urinalysis to evaluate for  UTI. A CT of the head and chest x-ray will also be ordered. The patient will receive an antirheumatic as well as intravenous fluids and be reevaluated for final disposition.  ----------------------------------------- 3:20 PM on 12/06/2016 -----------------------------------------  The patient's workup in the emergency department has been reassuring. He has remained hemodynamically stable and is now asymptomatic. His blood work is reassuring, he does not have a urinary tract infection. His EKG does not show any ischemia or arrhythmia. His chest x-ray does not show any acute cardiopulmonary process. The CT of his head does not show any acute intracranial process. At this time the patient is safe for discharge. I have discussed follow-up instructions as well as return precautions with him. ____________________________________________  FINAL CLINICAL IMPRESSION(S) / ED DIAGNOSES  Final diagnoses:  Postural lightheadedness  Numbness  Non-intractable vomiting with nausea, unspecified vomiting type         NEW MEDICATIONS STARTED DURING THIS VISIT:  Discharge Medication List as of 12/06/2016  3:20 PM    START taking these medications   Details  ondansetron (ZOFRAN ODT) 4 MG disintegrating tablet Take 1 tablet (4 mg total) by mouth every 8 (eight) hours as needed for nausea or vomiting., Starting Wed 12/06/2016, Print          Rockne Menghini, MD 12/06/16 2048

## 2016-12-06 NOTE — ED Notes (Signed)
Dr. Norman at bedside.  

## 2016-12-06 NOTE — ED Triage Notes (Signed)
Patient presents to ED via POV from home with c/o generalized weakness and "pain all over". Patient also reports nausea and vomiting. Patient ambulatory to triage.

## 2017-04-30 ENCOUNTER — Emergency Department
Admission: EM | Admit: 2017-04-30 | Discharge: 2017-04-30 | Disposition: A | Discharge status: Home / Self Care | Payer: Self-pay | Attending: Emergency Medicine | Admitting: Emergency Medicine

## 2017-04-30 ENCOUNTER — Encounter: Payer: Self-pay | Admitting: Emergency Medicine

## 2017-04-30 ENCOUNTER — Other Ambulatory Visit: Payer: Self-pay

## 2017-04-30 ENCOUNTER — Emergency Department: Payer: Self-pay

## 2017-04-30 DIAGNOSIS — Z87891 Personal history of nicotine dependence: Secondary | ICD-10-CM | POA: Insufficient documentation

## 2017-04-30 DIAGNOSIS — N200 Calculus of kidney: Secondary | ICD-10-CM | POA: Insufficient documentation

## 2017-04-30 HISTORY — DX: Calculus of kidney: N20.0

## 2017-04-30 LAB — URINALYSIS, COMPLETE (UACMP) WITH MICROSCOPIC
Bacteria, UA: NONE SEEN
Bilirubin Urine: NEGATIVE
GLUCOSE, UA: NEGATIVE mg/dL
Ketones, ur: NEGATIVE mg/dL
Leukocytes, UA: NEGATIVE
NITRITE: NEGATIVE
Protein, ur: 30 mg/dL — AB
Specific Gravity, Urine: 1.019 (ref 1.005–1.030)
WBC, UA: NONE SEEN WBC/hpf (ref 0–5)
pH: 5 (ref 5.0–8.0)

## 2017-04-30 LAB — COMPREHENSIVE METABOLIC PANEL
ALK PHOS: 71 U/L (ref 38–126)
ALT: 23 U/L (ref 17–63)
ANION GAP: 11 (ref 5–15)
AST: 24 U/L (ref 15–41)
Albumin: 4.2 g/dL (ref 3.5–5.0)
BILIRUBIN TOTAL: 0.7 mg/dL (ref 0.3–1.2)
BUN: 17 mg/dL (ref 6–20)
CALCIUM: 9.5 mg/dL (ref 8.9–10.3)
CO2: 25 mmol/L (ref 22–32)
CREATININE: 0.92 mg/dL (ref 0.61–1.24)
Chloride: 101 mmol/L (ref 101–111)
GFR calc non Af Amer: >60 mL/min (ref >60)
Glucose, Bld: 101 mg/dL — ABNORMAL HIGH (ref 65–99)
Potassium: 3.9 mmol/L (ref 3.5–5.1)
Sodium: 137 mmol/L (ref 135–145)
TOTAL PROTEIN: 8 g/dL (ref 6.5–8.1)

## 2017-04-30 LAB — CBC
HEMATOCRIT: 47.2 % (ref 40.0–52.0)
HEMOGLOBIN: 15.4 g/dL (ref 13.0–18.0)
MCH: 28.4 pg (ref 26.0–34.0)
MCHC: 32.6 g/dL (ref 32.0–36.0)
MCV: 87.1 fL (ref 80.0–100.0)
Platelets: 239 10*3/uL (ref 150–440)
RBC: 5.42 MIL/uL (ref 4.40–5.90)
RDW: 13.9 % (ref 11.5–14.5)
WBC: 10.5 10*3/uL (ref 3.8–10.6)

## 2017-04-30 LAB — LIPASE, BLOOD: Lipase: 36 U/L (ref 11–51)

## 2017-04-30 MED ORDER — ONDANSETRON 4 MG PO TBDP: 4.0000 mg | ORAL_TABLET | Freq: Three times a day (TID) | ORAL | 0 refills | Status: AC | PRN

## 2017-04-30 MED ORDER — SODIUM CHLORIDE 0.9 % IV BOLUS (SEPSIS)
1000.0000 mL | Freq: Once | INTRAVENOUS | Status: AC
  Administered 2017-04-30: 1000 mL via INTRAVENOUS

## 2017-04-30 MED ORDER — TAMSULOSIN HCL 0.4 MG PO CAPS: 0.4000 mg | ORAL_CAPSULE | Freq: Every day | ORAL | 0 refills | Status: DC

## 2017-04-30 MED ORDER — OXYCODONE-ACETAMINOPHEN 10-325 MG PO TABS: 1.0000 | ORAL_TABLET | Freq: Four times a day (QID) | ORAL | 0 refills | Status: DC | PRN

## 2017-04-30 MED ORDER — CIPROFLOXACIN HCL 500 MG PO TABS: 500.0000 mg | ORAL_TABLET | Freq: Two times a day (BID) | ORAL | 0 refills | Status: DC

## 2017-04-30 NOTE — ED Provider Notes (Signed)
[facility] Emergency Department Provider Note  ____________________________________________   First MD Initiated Contact with Patient 04/30/17 1604     (approximate)  I have reviewed the triage vital signs and the nursing notes.   HISTORY  Chief Complaint Flank Pain  HPI Cory Gardner is a 52 y.o. male who presents to the emergency department for treatment and evaluation of left flank pain that started about 8am. No fever, nausea, vomiting, or diarrhea. No dysuria or STD exposure that he knows of. He has a history of kidney stones--last was about 20 years ago. No alleviating measures have been attempted for this complaint prior to arrival.   Past Medical History:  Diagnosis Date  . Kidney stones   . Lung nodule     There are no active problems to display for this patient.   History reviewed. No pertinent surgical history.  Prior to Admission medications   Medication Sig Start Date End Date Taking? Authorizing Provider  ciprofloxacin (CIPRO) 500 MG tablet Take 1 tablet (500 mg total) by mouth 2 (two) times daily. 04/30/17   Advaith Lamarque, Rulon Eisenmenger B, FNP  ondansetron (ZOFRAN-ODT) 4 MG disintegrating tablet Take 1 tablet (4 mg total) by mouth every 8 (eight) hours as needed for nausea or vomiting. 04/30/17   Elajah Kunsman B, FNP  oxyCODONE-acetaminophen (PERCOCET) 10-325 MG tablet Take 1 tablet by mouth every 6 (six) hours as needed for pain. 04/30/17 04/30/18  Ephram Kornegay, Rulon Eisenmenger B, FNP  tamsulosin (FLOMAX) 0.4 MG CAPS capsule Take 1 capsule (0.4 mg total) by mouth daily. 04/30/17   Chinita Pester, FNP    Allergies Patient has no known allergies.  No family history on file.  Social History Social History   Tobacco Use  . Smoking status: Former Smoker    Types: Cigarettes  . Smokeless tobacco: Never Used  Substance Use Topics  . Alcohol use: Yes    Comment: occ  . Drug use: No    Review of Systems  Constitutional: No fever/chills ENT: No sore  throat. Cardiovascular: Denies chest pain. Respiratory: Denies shortness of breath or cough.. Gastrointestinal: Positive for left side pelvic pain.  No nausea, no vomiting.  No diarrhea.  No constipation. Genitourinary: Negative for dysuria. Musculoskeletal: Positive for left flank pain. Skin: Negative for rash. Neurological: Negative for headaches, focal weakness or numbness. ____________________________________________   PHYSICAL EXAM:  VITAL SIGNS: ED Triage Vitals  Enc Vitals Group     BP 04/30/17 1430 135/90     Pulse Rate 04/30/17 1430 73     Resp 04/30/17 1430 20     Temp 04/30/17 1430 97.7 F (36.5 C)     Temp Source 04/30/17 1430 Oral     SpO2 04/30/17 1430 97 %     Weight 04/30/17 1431 188 lb (85.3 kg)     Height 04/30/17 1431 6\' 1"  (1.854 m)     Head Circumference --      Peak Flow --      Pain Score 04/30/17 1431 8     Pain Loc --      Pain Edu? --      Excl. in GC? --     Constitutional: Alert and oriented. Uncomfortable appearing and in no acute distress. Eyes: Conjunctivae are normal. Head: Atraumatic. Nose: No congestion/rhinnorhea. Mouth/Throat: Mucous membranes are moist.  Oropharynx non-erythematous. Neck: No stridor. Supple  Cardiovascular: Normal rate, regular rhythm. Grossly normal heart sounds.  Good peripheral circulation. Respiratory: Normal respiratory effort.  No retractions. Lungs CTAB. Gastrointestinal: Soft.  Left lower quadrant tenderness.  No distention. No abdominal bruits. Left side CVA tenderness. Musculoskeletal: No lower extremity tenderness nor edema. Neurologic:  Normal speech and language. No gross focal neurologic deficits are appreciated. No gait instability. Skin:  Skin is warm, dry and intact. Psychiatric: Mood and affect are normal. Speech and behavior are normal. ____________________________________________   LABS (all labs ordered are listed, but only abnormal results are displayed)  Labs Reviewed  COMPREHENSIVE  METABOLIC PANEL - Abnormal; Notable for the following components:      Result Value   Glucose, Bld 101 (*)    All other components within normal limits  URINALYSIS, COMPLETE (UACMP) WITH MICROSCOPIC - Abnormal; Notable for the following components:   Color, Urine AMBER (*)    APPearance HAZY (*)    Hgb urine dipstick LARGE (*)    Protein, ur 30 (*)    Squamous Epithelial / LPF 0-5 (*)    All other components within normal limits  CBC  LIPASE, BLOOD   ____________________________________________  EKG  Not indicated. ____________________________________________  RADIOLOGY  Ct Renal Stone Study  Result Date: 04/30/2017 CLINICAL DATA:  Flank pain for several hours EXAM: CT ABDOMEN AND PELVIS WITHOUT CONTRAST TECHNIQUE: Multidetector CT imaging of the abdomen and pelvis was performed following the standard protocol without IV contrast. COMPARISON:  09/07/2015 FINDINGS: Lower chest: No acute abnormality. Hepatobiliary: Liver demonstrates a hypodensity posteriorly in the right lobe of the liver best seen on image number 33 of series 2. This likely represents a small cyst and is stable from the prior study. Gallbladder is partially distended. Pancreas: Unremarkable. No pancreatic ductal dilatation or surrounding inflammatory changes. Spleen: Normal in size without focal abnormality. Adrenals/Urinary Tract: The adrenal glands are within normal limits. The kidneys show a tiny nonobstructing stone in the lower pole of the right kidney. No obstructive changes on the right are noted. The bladder is decompressed. The left kidney demonstrates very mild hydronephrosis secondary to a proximal UPJ stone best seen on image number 44 of series 2 and image number 78 of series 5. It measures approximately 9 mm in greatest length. This has migrated from the lower pole of the left kidney as seen on prior exam. The previously seen exophytic cyst in the left kidney has resolved in the interval. Stomach/Bowel: The  appendix is within normal limits. No obstructive or inflammatory changes noted. Vascular/Lymphatic: No significant vascular findings are present. No enlarged abdominal or pelvic lymph nodes. Reproductive: Prostate is unremarkable. Other: No abdominal wall hernia or abnormality. No abdominopelvic ascites. Musculoskeletal: Degenerative changes of the thoracolumbar spine are seen. No acute bony abnormality is noted. IMPRESSION: 9 mm left UPJ stone with mild obstructive changes. Tiny nonobstructing right renal stone. Chronic changes as described above. Electronically Signed   By: Alcide CleverMark  Lukens M.D.   On: 04/30/2017 16:24    ____________________________________________   PROCEDURES  Procedure(s) performed: None  Procedures  Critical Care performed: No  ____________________________________________   INITIAL IMPRESSION / ASSESSMENT AND PLAN / ED COURSE   52 year old male presenting to the emergency department for evaluation of left flank pain. CT image showing 9mm stone at the UPJ and mild obstruction as well as a small non-obstructing right side renal stone. Patient's pain is well controlled during his ER stay. He states that upon arrival, he urinated and the pain significantly decreased. CT results were discussed with the patient who is to call the urologist in the morning to schedule a follow up appointment. He is to return to  the ER for symptoms that change or worsen if he is unable to see primary care or the specialist.  ___________________________________________   FINAL CLINICAL IMPRESSION(S) / ED DIAGNOSES  Final diagnoses:  Nephrolithiasis     ED Discharge Orders        Ordered    ondansetron (ZOFRAN-ODT) 4 MG disintegrating tablet  Every 8 hours PRN     04/30/17 1717    tamsulosin (FLOMAX) 0.4 MG CAPS capsule  Daily     04/30/17 1717    oxyCODONE-acetaminophen (PERCOCET) 10-325 MG tablet  Every 6 hours PRN     04/30/17 1717    ciprofloxacin (CIPRO) 500 MG tablet  2 times  daily     04/30/17 1717       Note:  This document was prepared using Dragon voice recognition software and may include unintentional dictation errors.    Chinita Pester, FNP 04/30/17 Carlis Stable    Phineas Semen, MD 04/30/17 2019

## 2017-04-30 NOTE — ED Triage Notes (Signed)
L flank pain began 8 am. Denies burning with urination.

## 2017-04-30 NOTE — ED Notes (Addendum)
See triage note   States he developed flank pain since about 8 am  Denies any fever ,or vomiting  But has had some nausea  States pain is non radiating

## 2017-05-03 ENCOUNTER — Other Ambulatory Visit: Payer: Self-pay

## 2017-05-03 ENCOUNTER — Encounter (HOSPITAL_COMMUNITY): Payer: Self-pay | Admitting: Emergency Medicine

## 2017-05-03 ENCOUNTER — Emergency Department (HOSPITAL_COMMUNITY)
Admission: EM | Admit: 2017-05-03 | Discharge: 2017-05-03 | Disposition: A | Discharge status: Home / Self Care | Payer: PRIVATE HEALTH INSURANCE | Attending: Emergency Medicine | Admitting: Emergency Medicine

## 2017-05-03 ENCOUNTER — Emergency Department (HOSPITAL_COMMUNITY): Payer: PRIVATE HEALTH INSURANCE

## 2017-05-03 DIAGNOSIS — Z79899 Other long term (current) drug therapy: Secondary | ICD-10-CM | POA: Insufficient documentation

## 2017-05-03 DIAGNOSIS — N201 Calculus of ureter: Secondary | ICD-10-CM | POA: Insufficient documentation

## 2017-05-03 DIAGNOSIS — Z87891 Personal history of nicotine dependence: Secondary | ICD-10-CM | POA: Insufficient documentation

## 2017-05-03 LAB — CBC WITH DIFFERENTIAL/PLATELET
Basophils Absolute: 0 10*3/uL (ref 0.0–0.1)
Basophils Relative: 0 %
EOS PCT: 1 %
Eosinophils Absolute: 0.1 10*3/uL (ref 0.0–0.7)
HCT: 45.8 % (ref 39.0–52.0)
Hemoglobin: 14.7 g/dL (ref 13.0–17.0)
LYMPHS ABS: 2.5 10*3/uL (ref 0.7–4.0)
LYMPHS PCT: 19 %
MCH: 28.6 pg (ref 26.0–34.0)
MCHC: 32.1 g/dL (ref 30.0–36.0)
MCV: 89.1 fL (ref 78.0–100.0)
MONO ABS: 1.6 10*3/uL — AB (ref 0.1–1.0)
MONOS PCT: 13 %
Neutro Abs: 8.6 10*3/uL — ABNORMAL HIGH (ref 1.7–7.7)
Neutrophils Relative %: 67 %
PLATELETS: 207 10*3/uL (ref 150–400)
RBC: 5.14 MIL/uL (ref 4.22–5.81)
RDW: 13.2 % (ref 11.5–15.5)
WBC: 12.8 10*3/uL — ABNORMAL HIGH (ref 4.0–10.5)

## 2017-05-03 LAB — BASIC METABOLIC PANEL
Anion gap: 12 (ref 5–15)
BUN: 20 mg/dL (ref 6–20)
CO2: 25 mmol/L (ref 22–32)
Calcium: 8.9 mg/dL (ref 8.9–10.3)
Chloride: 103 mmol/L (ref 101–111)
Creatinine, Ser: 1.35 mg/dL — ABNORMAL HIGH (ref 0.61–1.24)
GFR calc Af Amer: >60 mL/min (ref >60)
GFR, EST NON AFRICAN AMERICAN: 59 mL/min — AB (ref >60)
GLUCOSE: 96 mg/dL (ref 65–99)
POTASSIUM: 3.8 mmol/L (ref 3.5–5.1)
Sodium: 140 mmol/L (ref 135–145)

## 2017-05-03 LAB — URINALYSIS, ROUTINE W REFLEX MICROSCOPIC
BILIRUBIN URINE: NEGATIVE
GLUCOSE, UA: NEGATIVE mg/dL
HGB URINE DIPSTICK: NEGATIVE
Ketones, ur: NEGATIVE mg/dL
Leukocytes, UA: NEGATIVE
Nitrite: NEGATIVE
PH: 5 (ref 5.0–8.0)
Protein, ur: NEGATIVE mg/dL
Specific Gravity, Urine: 1.019 (ref 1.005–1.030)

## 2017-05-03 MED ORDER — OXYCODONE-ACETAMINOPHEN 5-325 MG PO TABS: 1.0000 | ORAL_TABLET | Freq: Four times a day (QID) | ORAL | 0 refills | Status: DC | PRN

## 2017-05-03 MED ORDER — NAPROXEN 500 MG PO TABS: 500.0000 mg | ORAL_TABLET | Freq: Two times a day (BID) | ORAL | 0 refills | Status: DC

## 2017-05-03 NOTE — ED Provider Notes (Signed)
[facility] Provider Note   CSN: [id number] Arrival date & time: 05/03/17  1051     History   Chief Complaint Chief Complaint  Patient presents with  . Flank Pain    HPI Dawna PartChris Talarico is a 52 y.o. male.  Patient with diagnosis of left-sided kidney stone your stone on Monday at CT scan at that time showed a 9 mm stone.  Patient with persistent pain.  Was not really given follow-up with urology.  Patient was treated with Flomax Cipro and Percocet.  Patient states the left flank pain is persistent.      Past Medical History:  Diagnosis Date  . Kidney stones   . Lung nodule     There are no active problems to display for this patient.   History reviewed. No pertinent surgical history.     Home Medications    Prior to Admission medications   Medication Sig Start Date End Date Taking? Authorizing Provider  ciprofloxacin (CIPRO) 500 MG tablet Take 1 tablet (500 mg total) by mouth 2 (two) times daily. 04/30/17  Yes Triplett, Cari B, FNP  ondansetron (ZOFRAN-ODT) 4 MG disintegrating tablet Take 1 tablet (4 mg total) by mouth every 8 (eight) hours as needed for nausea or vomiting. 04/30/17  Yes Triplett, Cari B, FNP  oxyCODONE-acetaminophen (PERCOCET) 10-325 MG tablet Take 1 tablet by mouth every 6 (six) hours as needed for pain. 04/30/17 04/30/18 Yes Triplett, Cari B, FNP  tamsulosin (FLOMAX) 0.4 MG CAPS capsule Take 1 capsule (0.4 mg total) by mouth daily. 04/30/17  Yes Triplett, Cari B, FNP  naproxen (NAPROSYN) 500 MG tablet Take 1 tablet (500 mg total) by mouth 2 (two) times daily. 05/03/17   Vanetta MuldersZackowski, Dajaun Goldring, MD  oxyCODONE-acetaminophen (PERCOCET/ROXICET) 5-325 MG tablet Take 1 tablet by mouth every 6 (six) hours as needed for severe pain. 05/03/17   Vanetta MuldersZackowski, Laqueisha Catalina, MD    Family History History reviewed. No pertinent family history.  Social History Social History   Tobacco Use  . Smoking status: Former Smoker    Types: Cigarettes  . Smokeless  tobacco: Never Used  Substance Use Topics  . Alcohol use: Yes    Comment: occ  . Drug use: No     Allergies   Patient has no known allergies.   Review of Systems Review of Systems  Constitutional: Negative for fever.  HENT: Negative for congestion.   Eyes: Negative for redness.  Respiratory: Negative for shortness of breath.   Cardiovascular: Negative for chest pain.  Gastrointestinal: Positive for abdominal pain.  Genitourinary: Positive for difficulty urinating and flank pain.  Musculoskeletal: Positive for back pain.  Skin: Negative for rash.  Neurological: Negative for syncope and headaches.  Hematological: Does not bruise/bleed easily.  Psychiatric/Behavioral: Negative for confusion.     Physical Exam Updated Vital Signs BP 108/68 (BP Location: Right Arm)   Pulse 64   Temp 98 F (36.7 C) (Oral)   Resp 16   Ht 1.854 m (6\' 1" )   Wt 86.2 kg (190 lb)   SpO2 100%   BMI 25.07 kg/m   Physical Exam  Constitutional: He is oriented to person, place, and time. He appears well-developed and well-nourished. No distress.  HENT:  Head: Normocephalic and atraumatic.  Mouth/Throat: Oropharynx is clear and moist.  Eyes: Conjunctivae and EOM are normal. Pupils are equal, round, and reactive to light.  Neck: Neck supple.  Cardiovascular: Normal rate, regular rhythm and normal heart sounds.  Pulmonary/Chest: Effort normal and breath sounds normal.  No respiratory distress.  Abdominal: Soft. Bowel sounds are normal. There is no tenderness.  Musculoskeletal: Normal range of motion. He exhibits no edema.  Neurological: He is alert and oriented to person, place, and time. No cranial nerve deficit or sensory deficit. He exhibits normal muscle tone. Coordination normal.  Skin: Skin is warm.  Nursing note and vitals reviewed.    ED Treatments / Results  Labs (all labs ordered are listed, but only abnormal results are displayed) Labs Reviewed  CBC WITH DIFFERENTIAL/PLATELET -  Abnormal; Notable for the following components:      Result Value   WBC 12.8 (*)    Neutro Abs 8.6 (*)    Monocytes Absolute 1.6 (*)    All other components within normal limits  BASIC METABOLIC PANEL - Abnormal; Notable for the following components:   Creatinine, Ser 1.35 (*)    GFR calc non Af Amer 59 (*)    All other components within normal limits  URINALYSIS, ROUTINE W REFLEX MICROSCOPIC    EKG  EKG Interpretation None       Radiology Ct Renal Stone Study  Result Date: 05/03/2017 CLINICAL DATA:  Continued left flank pain since 04/30/2017 with hematuria. Recurrent stone disease. EXAM: CT ABDOMEN AND PELVIS WITHOUT CONTRAST TECHNIQUE: Multidetector CT imaging of the abdomen and pelvis was performed following the standard protocol without IV contrast. COMPARISON:  None. FINDINGS: Lower chest: No acute abnormality. Hepatobiliary: Stable right hepatic hypodensity measuring 9 mm compatible with a cyst or hemangioma. Gallbladder is free of radiopaque stones. Punctate right hepatic granuloma. Pancreas: Normal Spleen: Normal Adrenals/Urinary Tract: Normal bilateral adrenal glands. Tiny bilateral nonobstructing lower pole renal calculi. Mild left-sided hydroureteronephrosis secondary to a proximal left ureteral stone currently estimated at 6 x 7 mm. This has minimally migrated distally since recent comparison, seen at the mid L3 vertebral body level. Urinary bladder is unremarkable. Stomach/Bowel: Stomach is within normal limits. Appendix appears normal. No evidence of bowel wall thickening, distention, or inflammatory changes. Vascular/Lymphatic: No significant vascular findings are present. No enlarged abdominal or pelvic lymph nodes. Reproductive: Prostate is unremarkable. Other: No abdominal wall hernia or abnormality. No abdominopelvic ascites. Musculoskeletal: No acute or significant osseous findings. IMPRESSION: Redemonstration of left proximal 6 x 7 mm ureteral stone causing mild left-sided  hydroureteronephrosis. The stone has slightly migrated along the left ureter since prior. Otherwise no significant change. Bilateral nonobstructing renal calculi. Electronically Signed   By: Tollie Eth M.D.   On: 05/03/2017 17:05    Procedures Procedures (including critical care time)  Medications Ordered in ED Medications - No data to display   Initial Impression / Assessment and Plan / ED Course  I have reviewed the triage vital signs and the nursing notes.  Pertinent labs & imaging results that were available during my care of the patient were reviewed by me and considered in my medical decision making (see chart for details).     CT scan scan today shows persistent left-sided ureteral stone with slight movement distally compared to Monday.  Stone size here today being measured at about 7 mm patient's urinalysis does show some slight increase in the creatinine.  1.35.  No evidence of any urinary tract infection.  Patient will be treated symptomatically.  Patient given direct phone number to [facility] for follow-up.  Work note provided to be out of work.  Patient nontoxic no acute distress.  Final Clinical Impressions(s) / ED Diagnoses   Final diagnoses:  Ureterolithiasis  Left ureteral stone  ED Discharge Orders        Ordered    naproxen (NAPROSYN) 500 MG tablet  2 times daily     05/03/17 1806    oxyCODONE-acetaminophen (PERCOCET/ROXICET) 5-325 MG tablet  Every 6 hours PRN     05/03/17 1806       Vanetta Mulders, MD 05/03/17 1812

## 2017-05-03 NOTE — Discharge Instructions (Signed)
Make an appointment to follow-up with urology.  Give them a call in the morning.  Work note provided to be out of work until Tuesday.  Continue to hydrate yourself well.  Percocet renewed.  Also trial of Naprosyn which can be very effective for kidney stone pain.

## 2017-05-03 NOTE — ED Triage Notes (Signed)
PT c/o continued Left flank pain and dark colored urine since ED visit on 04/30/17 at [facility] ED. PT states hasn't been able to follow up with urologist yet.

## 2017-05-23 ENCOUNTER — Encounter: Payer: Self-pay | Admitting: Radiology

## 2017-05-23 ENCOUNTER — Other Ambulatory Visit: Payer: Self-pay | Admitting: Radiology

## 2017-05-23 ENCOUNTER — Ambulatory Visit: Payer: PRIVATE HEALTH INSURANCE

## 2017-05-23 ENCOUNTER — Ambulatory Visit (INDEPENDENT_AMBULATORY_CARE_PROVIDER_SITE_OTHER): Payer: PRIVATE HEALTH INSURANCE | Admitting: Urology

## 2017-05-23 ENCOUNTER — Encounter: Payer: Self-pay | Admitting: Urology

## 2017-05-23 ENCOUNTER — Ambulatory Visit: Admission: RE | Admit: 2017-05-23 | Payer: PRIVATE HEALTH INSURANCE | Source: Ambulatory Visit

## 2017-05-23 VITALS — BP 132/82 | HR 87 | Ht 73.0 in | Wt 226.0 lb

## 2017-05-23 DIAGNOSIS — N132 Hydronephrosis with renal and ureteral calculous obstruction: Secondary | ICD-10-CM | POA: Diagnosis not present

## 2017-05-23 DIAGNOSIS — R3129 Other microscopic hematuria: Secondary | ICD-10-CM

## 2017-05-23 DIAGNOSIS — N201 Calculus of ureter: Secondary | ICD-10-CM | POA: Diagnosis not present

## 2017-05-23 MED ORDER — CIPROFLOXACIN HCL 500 MG PO TABS: 500.0000 mg | ORAL_TABLET | ORAL | Status: DC

## 2017-05-23 MED ORDER — DIPHENHYDRAMINE HCL 25 MG PO CAPS: 25.0000 mg | ORAL_CAPSULE | ORAL | Status: DC

## 2017-05-23 MED ORDER — TAMSULOSIN HCL 0.4 MG PO CAPS: 0.4000 mg | ORAL_CAPSULE | Freq: Every day | ORAL | 0 refills | Status: AC

## 2017-05-23 MED ORDER — DIAZEPAM 5 MG PO TABS
10.0000 mg | ORAL_TABLET | ORAL | Status: DC
  Administered 2017-05-24: 10 mg via ORAL

## 2017-05-23 MED ORDER — ONDANSETRON HCL 4 MG/2ML IJ SOLN: 4.0000 mg | Freq: Once | INTRAMUSCULAR | Status: DC

## 2017-05-23 MED ORDER — TAMSULOSIN HCL 0.4 MG PO CAPS: 0.4000 mg | ORAL_CAPSULE | Freq: Every day | ORAL | 0 refills | Status: DC

## 2017-05-23 MED ORDER — SODIUM CHLORIDE 0.9 % IV SOLN: INTRAVENOUS | Status: DC

## 2017-05-23 NOTE — H&P (View-Only) (Signed)
05/23/2017 4:59 PM   Cory Gardner 1965-08-05 [id number]  Referring provider: No referring provider defined for this encounter.  Chief Complaint  Patient presents with  . Nephrolithiasis    HPI: Patient is a 52 year old Caucasian male who presents today for a left ureteral stone.    He has been seen in the ED on 04/30/2017 and 05/03/2017 for this stone.  Creatinine at 1.35 up from baseline of 0.90.   WBC count of 12.8.   UA + for TNTC RBC's on 05/03/2017.    Most recent CT Renal stone study performed on 05/03/2017 noted normal bilateral adrenal glands. Tiny bilateral nonobstructing lower pole renal calculi. Mild left-sided hydroureteronephrosis secondary to a proximal left ureteral stone currently estimated at 6 x 7 mm. This has minimally migrated distally since recent comparison, seen at the mid L3 vertebral body level. Urinary bladder is unremarkable.    Today, he is having irritative urinary symptoms.   He is feeling a nagging sensation in  his left suprapubic region.  He would like more Flomax.  Patient denies any gross hematuria, dysuria or suprapubic/flank pain.  Patient denies any fevers, chills, nausea or vomiting.  He has not passed a fragment.    He has a prior history of stones.  Stone composition is unknown.    PMH: Past Medical History:  Diagnosis Date  . Acid reflux   . Hypertension   . Kidney stones   . Lung nodule     Surgical History: No past surgical history on file.  Home Medications:  Allergies as of 05/23/2017   No Known Allergies     Medication List        Accurate as of 05/23/17  4:59 PM. Always use your most recent med list.          ondansetron 4 MG disintegrating tablet Commonly known as:  ZOFRAN-ODT Take 1 tablet (4 mg total) by mouth every 8 (eight) hours as needed for nausea or vomiting.   tamsulosin 0.4 MG Caps capsule Commonly known as:  FLOMAX Take 1 capsule (0.4 mg total) by mouth daily.       Allergies: No Known  Allergies  Family History: No family history on file.  Social History:  reports that he has quit smoking. His smoking use included cigarettes. he has never used smokeless tobacco. He reports that he drinks alcohol. He reports that he does not use drugs.  ROS: UROLOGY Frequent Urination?: Yes Hard to postpone urination?: Yes Burning/pain with urination?: Yes Get up at night to urinate?: Yes Leakage of urine?: No Urine stream starts and stops?: No Trouble starting stream?: Yes Do you have to strain to urinate?: Yes Blood in urine?: Yes Urinary tract infection?: No Sexually transmitted disease?: No Injury to kidneys or bladder?: No Painful intercourse?: No Weak stream?: No Erection problems?: No Penile pain?: No  Gastrointestinal Nausea?: No Vomiting?: No Indigestion/heartburn?: No Diarrhea?: No Constipation?: No  Constitutional Fever: No Night sweats?: No Weight loss?: No Fatigue?: Yes  Skin Skin rash/lesions?: No Itching?: No  Eyes Blurred vision?: No Double vision?: No  Ears/Nose/Throat Sore throat?: No Sinus problems?: No  Hematologic/Lymphatic Swollen glands?: No Easy bruising?: No  Cardiovascular Leg swelling?: No Chest pain?: No  Respiratory Cough?: No Shortness of breath?: No  Endocrine Excessive thirst?: No  Musculoskeletal Back pain?: Yes Joint pain?: No  Neurological Headaches?: Yes Dizziness?: No  Psychologic Depression?: No Anxiety?: No  Physical Exam: BP 132/82   Pulse 87   Ht 6' 1"  (  1.854 m)   Wt 226 lb (102.5 kg)   BMI 29.82 kg/m   Constitutional: Well nourished. Alert and oriented, No acute distress. HEENT: [place] AT, moist mucus membranes. Trachea midline, no masses. Cardiovascular: No clubbing, cyanosis, or edema. Respiratory: Normal respiratory effort, no increased work of breathing. GI: Abdomen is soft, non tender, non distended, no abdominal masses. Liver and spleen not palpable.  No hernias appreciated.  Stool  sample for occult testing is not indicated.   GU: No CVA tenderness.  No bladder fullness or masses.   Skin: No rashes, bruises or suspicious lesions. Lymph: No cervical or inguinal adenopathy. Neurologic: Grossly intact, no focal deficits, moving all 4 extremities. Psychiatric: Normal mood and affect.  Laboratory Data: Lab Results  Component Value Date   WBC 12.8 (H) 05/03/2017   HGB 14.7 05/03/2017   HCT 45.8 05/03/2017   MCV 89.1 05/03/2017   PLT 207 05/03/2017    Lab Results  Component Value Date   CREATININE 1.35 (H) 05/03/2017    No results found for: PSA  No results found for: TESTOSTERONE  No results found for: HGBA1C  No results found for: TSH  No results found for: CHOL, HDL, CHOLHDL, VLDL, LDLCALC  Lab Results  Component Value Date   AST 24 04/30/2017   Lab Results  Component Value Date   ALT 23 04/30/2017   No components found for: ALKALINEPHOPHATASE No components found for: BILIRUBINTOTAL  No results found for: ESTRADIOL  Urinalysis    Component Value Date/Time   COLORURINE YELLOW 05/03/2017 [place] 05/03/2017 1131   LABSPEC 1.019 05/03/2017 [place] 5.0 05/03/2017 [place] 05/03/2017 [place] 05/03/2017 [place] 05/03/2017 [place] 05/03/2017 [place] 05/03/2017 1131   NITRITE NEGATIVE 05/03/2017 1131   LEUKOCYTESUR NEGATIVE 05/03/2017 1131    I have reviewed the labs.   Pertinent Imaging: CLINICAL DATA:  Continued left flank pain since 04/30/2017 with hematuria. Recurrent stone disease.  EXAM: CT ABDOMEN AND PELVIS WITHOUT CONTRAST  TECHNIQUE: Multidetector CT imaging of the abdomen and pelvis was performed following the standard protocol without IV contrast.  COMPARISON:  None.  FINDINGS: Lower chest: No acute abnormality.  Hepatobiliary: Stable right hepatic hypodensity measuring 9 mm compatible with a cyst or  hemangioma. Gallbladder is free of radiopaque stones. Punctate right hepatic granuloma.  Pancreas: Normal  Spleen: Normal  Adrenals/Urinary Tract: Normal bilateral adrenal glands. Tiny bilateral nonobstructing lower pole renal calculi. Mild left-sided hydroureteronephrosis secondary to a proximal left ureteral stone currently estimated at 6 x 7 mm. This has minimally migrated distally since recent comparison, seen at the mid L3 vertebral body level. Urinary bladder is unremarkable.  Stomach/Bowel: Stomach is within normal limits. Appendix appears normal. No evidence of bowel wall thickening, distention, or inflammatory changes.  Vascular/Lymphatic: No significant vascular findings are present. No enlarged abdominal or pelvic lymph nodes.  Reproductive: Prostate is unremarkable.  Other: No abdominal wall hernia or abnormality. No abdominopelvic ascites.  Musculoskeletal: No acute or significant osseous findings.  IMPRESSION: Redemonstration of left proximal 6 x 7 mm ureteral stone causing mild left-sided hydroureteronephrosis. The stone has slightly migrated along the left ureter since prior. Otherwise no significant change.  Bilateral nonobstructing renal calculi.   Electronically Signed   By: Ashley Royalty M.D.   On: 05/03/2017 17:05  I have independently reviewed the films.    Assessment & Plan:    1.  Left ureteral stone  - patient has failed MET - explained that we could pursue URS or ESWL  -he would like to pursue ESWL as there is availability tomorrow  - I explained that ESWL is a means of pulverizing urinary stones without surgery using shockwave therapy  - I discussed the risks involved with ESWL consist of bruising to the skin and kidney region as a result of the shockwave, possibility of long-term kidney damage, development of high blood pressure and damage to the bowel or long, hematuria, urinary bleeding serious enough to require transfusion or  surgical repair or removal of the kidney is rare, rare chance of hematoma formation in the kidney and injuries to the spleen, liver or pancreas.  There is also the risk of urinary tract infection or any infection of the blood system or tissue.   There is also a possibility of resultant damage to male organs.  - I explained that sometimes the stone fragments can stack up in the ureter like coins, a phenomenon described as "Steinstrasse", that would result in a stent placement and/or URS for further treatment  - I informed the patient that IV sedation is typically used on the truck, but it some rare instances we need to use general anesthesia - the risks being infection, irregular heart beat, irregular BP, stroke, MI, CVA, paralysis, coma and/or death.  - schedule left ESWL   2. Left hydronephrosis  - A RUS will be obtained one month after he has recovered from ESWL  3. Microscopic hematuria  - We will continue to monitor the patient's UA after the treatment/passage of the stone to ensure the hematuria has resolved.  If hematuria persists, we will pursue a hematuria workup with CT Urogram and cystoscopy if appropriate.   Return for left ESWL .  These notes generated with voice recognition software. I apologize for typographical errors.  Zara Council, [facility] [address], [place], [place] [address] [phone]

## 2017-05-23 NOTE — Progress Notes (Signed)
05/23/2017 4:59 PM   Cory Gardner 16-Feb-1966 [id number]  Referring provider: No referring provider defined for this encounter.  Chief Complaint  Patient presents with  . Nephrolithiasis    HPI: Patient is a 52 year old Caucasian male who presents today for a left ureteral stone.    He has been seen in the ED on 04/30/2017 and 05/03/2017 for this stone.  Creatinine at 1.35 up from baseline of 0.90.   WBC count of 12.8.   UA + for TNTC RBC's on 05/03/2017.    Most recent CT Renal stone study performed on 05/03/2017 noted normal bilateral adrenal glands. Tiny bilateral nonobstructing lower pole renal calculi. Mild left-sided hydroureteronephrosis secondary to a proximal left ureteral stone currently estimated at 6 x 7 mm. This has minimally migrated distally since recent comparison, seen at the mid L3 vertebral body level. Urinary bladder is unremarkable.    Today, he is having irritative urinary symptoms.   He is feeling a nagging sensation in  his left suprapubic region.  He would like more Flomax.  Patient denies any gross hematuria, dysuria or suprapubic/flank pain.  Patient denies any fevers, chills, nausea or vomiting.  He has not passed a fragment.    He has a prior history of stones.  Stone composition is unknown.    PMH: Past Medical History:  Diagnosis Date  . Acid reflux   . Hypertension   . Kidney stones   . Lung nodule     Surgical History: No past surgical history on file.  Home Medications:  Allergies as of 05/23/2017   No Known Allergies     Medication List        Accurate as of 05/23/17  4:59 PM. Always use your most recent med list.          ondansetron 4 MG disintegrating tablet Commonly known as:  ZOFRAN-ODT Take 1 tablet (4 mg total) by mouth every 8 (eight) hours as needed for nausea or vomiting.   tamsulosin 0.4 MG Caps capsule Commonly known as:  FLOMAX Take 1 capsule (0.4 mg total) by mouth daily.       Allergies: No Known  Allergies  Family History: No family history on file.  Social History:  reports that he has quit smoking. His smoking use included cigarettes. he has never used smokeless tobacco. He reports that he drinks alcohol. He reports that he does not use drugs.  ROS: UROLOGY Frequent Urination?: Yes Hard to postpone urination?: Yes Burning/pain with urination?: Yes Get up at night to urinate?: Yes Leakage of urine?: No Urine stream starts and stops?: No Trouble starting stream?: Yes Do you have to strain to urinate?: Yes Blood in urine?: Yes Urinary tract infection?: No Sexually transmitted disease?: No Injury to kidneys or bladder?: No Painful intercourse?: No Weak stream?: No Erection problems?: No Penile pain?: No  Gastrointestinal Nausea?: No Vomiting?: No Indigestion/heartburn?: No Diarrhea?: No Constipation?: No  Constitutional Fever: No Night sweats?: No Weight loss?: No Fatigue?: Yes  Skin Skin rash/lesions?: No Itching?: No  Eyes Blurred vision?: No Double vision?: No  Ears/Nose/Throat Sore throat?: No Sinus problems?: No  Hematologic/Lymphatic Swollen glands?: No Easy bruising?: No  Cardiovascular Leg swelling?: No Chest pain?: No  Respiratory Cough?: No Shortness of breath?: No  Endocrine Excessive thirst?: No  Musculoskeletal Back pain?: Yes Joint pain?: No  Neurological Headaches?: Yes Dizziness?: No  Psychologic Depression?: No Anxiety?: No  Physical Exam: BP 132/82   Pulse 87   Ht 6' 1"  (  1.854 m)   Wt 226 lb (102.5 kg)   BMI 29.82 kg/m   Constitutional: Well nourished. Alert and oriented, No acute distress. HEENT: [place] AT, moist mucus membranes. Trachea midline, no masses. Cardiovascular: No clubbing, cyanosis, or edema. Respiratory: Normal respiratory effort, no increased work of breathing. GI: Abdomen is soft, non tender, non distended, no abdominal masses. Liver and spleen not palpable.  No hernias appreciated.  Stool  sample for occult testing is not indicated.   GU: No CVA tenderness.  No bladder fullness or masses.   Skin: No rashes, bruises or suspicious lesions. Lymph: No cervical or inguinal adenopathy. Neurologic: Grossly intact, no focal deficits, moving all 4 extremities. Psychiatric: Normal mood and affect.  Laboratory Data: Lab Results  Component Value Date   WBC 12.8 (H) 05/03/2017   HGB 14.7 05/03/2017   HCT 45.8 05/03/2017   MCV 89.1 05/03/2017   PLT 207 05/03/2017    Lab Results  Component Value Date   CREATININE 1.35 (H) 05/03/2017    No results found for: PSA  No results found for: TESTOSTERONE  No results found for: HGBA1C  No results found for: TSH  No results found for: CHOL, HDL, CHOLHDL, VLDL, LDLCALC  Lab Results  Component Value Date   AST 24 04/30/2017   Lab Results  Component Value Date   ALT 23 04/30/2017   No components found for: ALKALINEPHOPHATASE No components found for: BILIRUBINTOTAL  No results found for: ESTRADIOL  Urinalysis    Component Value Date/Time   COLORURINE YELLOW 05/03/2017 [place] 05/03/2017 1131   LABSPEC 1.019 05/03/2017 [place] 5.0 05/03/2017 [place] 05/03/2017 [place] 05/03/2017 [place] 05/03/2017 [place] 05/03/2017 [place] 05/03/2017 1131   NITRITE NEGATIVE 05/03/2017 1131   LEUKOCYTESUR NEGATIVE 05/03/2017 1131    I have reviewed the labs.   Pertinent Imaging: CLINICAL DATA:  Continued left flank pain since 04/30/2017 with hematuria. Recurrent stone disease.  EXAM: CT ABDOMEN AND PELVIS WITHOUT CONTRAST  TECHNIQUE: Multidetector CT imaging of the abdomen and pelvis was performed following the standard protocol without IV contrast.  COMPARISON:  None.  FINDINGS: Lower chest: No acute abnormality.  Hepatobiliary: Stable right hepatic hypodensity measuring 9 mm compatible with a cyst or  hemangioma. Gallbladder is free of radiopaque stones. Punctate right hepatic granuloma.  Pancreas: Normal  Spleen: Normal  Adrenals/Urinary Tract: Normal bilateral adrenal glands. Tiny bilateral nonobstructing lower pole renal calculi. Mild left-sided hydroureteronephrosis secondary to a proximal left ureteral stone currently estimated at 6 x 7 mm. This has minimally migrated distally since recent comparison, seen at the mid L3 vertebral body level. Urinary bladder is unremarkable.  Stomach/Bowel: Stomach is within normal limits. Appendix appears normal. No evidence of bowel wall thickening, distention, or inflammatory changes.  Vascular/Lymphatic: No significant vascular findings are present. No enlarged abdominal or pelvic lymph nodes.  Reproductive: Prostate is unremarkable.  Other: No abdominal wall hernia or abnormality. No abdominopelvic ascites.  Musculoskeletal: No acute or significant osseous findings.  IMPRESSION: Redemonstration of left proximal 6 x 7 mm ureteral stone causing mild left-sided hydroureteronephrosis. The stone has slightly migrated along the left ureter since prior. Otherwise no significant change.  Bilateral nonobstructing renal calculi.   Electronically Signed   By: Ashley Royalty M.D.   On: 05/03/2017 17:05  I have independently reviewed the films.    Assessment & Plan:    1.  Left ureteral stone  - patient has failed MET - explained that we could pursue URS or ESWL  -he would like to pursue ESWL as there is availability tomorrow  - I explained that ESWL is a means of pulverizing urinary stones without surgery using shockwave therapy  - I discussed the risks involved with ESWL consist of bruising to the skin and kidney region as a result of the shockwave, possibility of long-term kidney damage, development of high blood pressure and damage to the bowel or long, hematuria, urinary bleeding serious enough to require transfusion or  surgical repair or removal of the kidney is rare, rare chance of hematoma formation in the kidney and injuries to the spleen, liver or pancreas.  There is also the risk of urinary tract infection or any infection of the blood system or tissue.   There is also a possibility of resultant damage to male organs.  - I explained that sometimes the stone fragments can stack up in the ureter like coins, a phenomenon described as "Steinstrasse", that would result in a stent placement and/or URS for further treatment  - I informed the patient that IV sedation is typically used on the truck, but it some rare instances we need to use general anesthesia - the risks being infection, irregular heart beat, irregular BP, stroke, MI, CVA, paralysis, coma and/or death.  - schedule left ESWL   2. Left hydronephrosis  - A RUS will be obtained one month after he has recovered from ESWL  3. Microscopic hematuria  - We will continue to monitor the patient's UA after the treatment/passage of the stone to ensure the hematuria has resolved.  If hematuria persists, we will pursue a hematuria workup with CT Urogram and cystoscopy if appropriate.   Return for left ESWL .  These notes generated with voice recognition software. I apologize for typographical errors.  Zara Council, [facility] [address] [facility], [place] [address] [phone]

## 2017-05-24 ENCOUNTER — Ambulatory Visit: Payer: PRIVATE HEALTH INSURANCE

## 2017-05-24 ENCOUNTER — Encounter
Admission: RE | Disposition: A | Discharge status: Home / Self Care | Payer: Self-pay | Source: Ambulatory Visit | Attending: Urology

## 2017-05-24 ENCOUNTER — Other Ambulatory Visit: Payer: Self-pay

## 2017-05-24 ENCOUNTER — Ambulatory Visit
Admission: RE | Admit: 2017-05-24 | Discharge: 2017-05-24 | Disposition: A | Discharge status: Home / Self Care | Payer: PRIVATE HEALTH INSURANCE | Source: Ambulatory Visit | Attending: Urology | Admitting: Urology

## 2017-05-24 ENCOUNTER — Encounter: Payer: Self-pay | Admitting: *Deleted

## 2017-05-24 DIAGNOSIS — Z87442 Personal history of urinary calculi: Secondary | ICD-10-CM | POA: Insufficient documentation

## 2017-05-24 DIAGNOSIS — N200 Calculus of kidney: Secondary | ICD-10-CM

## 2017-05-24 DIAGNOSIS — Z87891 Personal history of nicotine dependence: Secondary | ICD-10-CM | POA: Insufficient documentation

## 2017-05-24 DIAGNOSIS — I10 Essential (primary) hypertension: Secondary | ICD-10-CM | POA: Insufficient documentation

## 2017-05-24 DIAGNOSIS — Z79899 Other long term (current) drug therapy: Secondary | ICD-10-CM | POA: Insufficient documentation

## 2017-05-24 DIAGNOSIS — N132 Hydronephrosis with renal and ureteral calculous obstruction: Secondary | ICD-10-CM | POA: Insufficient documentation

## 2017-05-24 DIAGNOSIS — K219 Gastro-esophageal reflux disease without esophagitis: Secondary | ICD-10-CM | POA: Insufficient documentation

## 2017-05-24 DIAGNOSIS — N201 Calculus of ureter: Secondary | ICD-10-CM

## 2017-05-24 HISTORY — PX: EXTRACORPOREAL SHOCK WAVE LITHOTRIPSY: SHX1557

## 2017-05-24 LAB — URINE DRUG SCREEN, QUALITATIVE (ARMC ONLY)
Amphetamines, Ur Screen: NOT DETECTED
Barbiturates, Ur Screen: NOT DETECTED
Benzodiazepine, Ur Scrn: NOT DETECTED
COCAINE METABOLITE, UR ~~LOC~~: NOT DETECTED
Cannabinoid 50 Ng, Ur ~~LOC~~: NOT DETECTED
MDMA (ECSTASY) UR SCREEN: NOT DETECTED
Methadone Scn, Ur: NOT DETECTED
Opiate, Ur Screen: NOT DETECTED
PHENCYCLIDINE (PCP) UR S: NOT DETECTED
TRICYCLIC, UR SCREEN: NOT DETECTED

## 2017-05-24 SURGERY — LITHOTRIPSY, ESWL
Anesthesia: Moderate Sedation | Laterality: Left

## 2017-05-24 MED ORDER — CIPROFLOXACIN HCL 500 MG PO TABS: 500.0000 mg | ORAL_TABLET | ORAL | Status: DC

## 2017-05-24 MED ORDER — DIPHENHYDRAMINE HCL 25 MG PO CAPS
ORAL_CAPSULE | ORAL | Status: AC
  Filled 2017-05-24: qty 1

## 2017-05-24 MED ORDER — ONDANSETRON HCL 4 MG/2ML IJ SOLN
4.0000 mg | Freq: Once | INTRAMUSCULAR | Status: AC | PRN
  Administered 2017-05-24: 4 mg via INTRAVENOUS

## 2017-05-24 MED ORDER — DIAZEPAM 5 MG PO TABS
ORAL_TABLET | ORAL | Status: AC
  Filled 2017-05-24: qty 2

## 2017-05-24 MED ORDER — CIPROFLOXACIN HCL 500 MG PO TABS
ORAL_TABLET | ORAL | Status: AC
  Administered 2017-05-24: 08:00:00
  Filled 2017-05-24: qty 1

## 2017-05-24 MED ORDER — HYDROCODONE-ACETAMINOPHEN 5-325 MG PO TABS: 1.0000 | ORAL_TABLET | Freq: Four times a day (QID) | ORAL | 0 refills | Status: AC | PRN

## 2017-05-24 MED ORDER — SODIUM CHLORIDE 0.9 % IV SOLN
INTRAVENOUS | Status: DC
  Administered 2017-05-24: 08:00:00 via INTRAVENOUS

## 2017-05-24 MED ORDER — DIAZEPAM 5 MG PO TABS: 10.0000 mg | ORAL_TABLET | ORAL | Status: DC

## 2017-05-24 MED ORDER — DIPHENHYDRAMINE HCL 25 MG PO CAPS
25.0000 mg | ORAL_CAPSULE | ORAL | Status: AC
  Administered 2017-05-24: 25 mg via ORAL

## 2017-05-24 MED ORDER — TAMSULOSIN HCL 0.4 MG PO CAPS: 0.4000 mg | ORAL_CAPSULE | Freq: Every day | ORAL | 0 refills | Status: DC

## 2017-05-24 MED ORDER — ONDANSETRON HCL 4 MG/2ML IJ SOLN
INTRAMUSCULAR | Status: AC
  Filled 2017-05-24: qty 2

## 2017-05-24 NOTE — Discharge Instructions (Signed)
See [facility] discharge instructions.

## 2017-05-24 NOTE — Interval H&P Note (Signed)
History and Physical Interval Note:  05/24/2017 9:59 AM  Cory Gardner  has presented today for surgery, with the diagnosis of Left Ureteral stone  The various methods of treatment have been discussed with the patient and family. After consideration of risks, benefits and other options for treatment, the patient has consented to  Procedure(s): EXTRACORPOREAL SHOCK WAVE LITHOTRIPSY (ESWL) (Left) as a surgical intervention .  The patient's history has been reviewed, patient examined, no change in status, stable for surgery.  I have reviewed the patient's chart and labs.  Questions were answered to the patient's satisfaction.     Vanna ScotlandAshley Larell Baney

## 2017-05-25 ENCOUNTER — Encounter: Payer: Self-pay | Admitting: Urology

## 2017-06-05 ENCOUNTER — Ambulatory Visit: Payer: Self-pay | Admitting: Urology

## 2017-06-07 ENCOUNTER — Encounter: Payer: Self-pay | Admitting: Urology

## 2017-06-07 ENCOUNTER — Ambulatory Visit
Admission: RE | Admit: 2017-06-07 | Discharge: 2017-06-07 | Disposition: A | Discharge status: Home / Self Care | Payer: Self-pay | Source: Ambulatory Visit | Attending: Urology | Admitting: Urology

## 2017-06-07 ENCOUNTER — Ambulatory Visit (INDEPENDENT_AMBULATORY_CARE_PROVIDER_SITE_OTHER): Payer: PRIVATE HEALTH INSURANCE | Admitting: Urology

## 2017-06-07 VITALS — BP 150/80 | HR 69 | Resp 16 | Ht 73.0 in | Wt 226.4 lb

## 2017-06-07 DIAGNOSIS — R3129 Other microscopic hematuria: Secondary | ICD-10-CM | POA: Diagnosis not present

## 2017-06-07 DIAGNOSIS — N132 Hydronephrosis with renal and ureteral calculous obstruction: Secondary | ICD-10-CM | POA: Diagnosis not present

## 2017-06-07 DIAGNOSIS — N201 Calculus of ureter: Secondary | ICD-10-CM

## 2017-06-07 LAB — URINALYSIS, COMPLETE
Bilirubin, UA: NEGATIVE
GLUCOSE, UA: NEGATIVE
LEUKOCYTES UA: NEGATIVE
NITRITE UA: NEGATIVE
Protein, UA: NEGATIVE
SPEC GRAV UA: 1.025 (ref 1.005–1.030)
Urobilinogen, Ur: 0.2 mg/dL (ref 0.2–1.0)
pH, UA: 5.5 (ref 5.0–7.5)

## 2017-06-07 LAB — MICROSCOPIC EXAMINATION
BACTERIA UA: NONE SEEN
WBC, UA: NONE SEEN /hpf (ref 0–?)

## 2017-06-07 NOTE — Progress Notes (Signed)
06/07/2017 9:50 AM   Dawna Part [birth year] [id number]  Referring provider: No referring provider defined for this encounter.  Chief Complaint  Patient presents with  . Nephrolithiasis    HPI: 52 yo WM with a history of a left ureteral stone and bilateral nephrolithiasis who is s/p left ESWL on 05/24/2016.  Background history Patient is a 52 year old Caucasian male who presents today for a left ureteral stone.  He has been seen in the ED on 04/30/2017 and 05/03/2017 for this stone.  Creatinine at 1.35 up from baseline of 0.90.   WBC count of 12.8.   UA + for TNTC RBC's on 05/03/2017.    Most recent CT Renal stone study performed on 05/03/2017 noted normal bilateral adrenal glands. Tiny bilateral nonobstructing lower pole renal calculi. Mild left-sided hydroureteronephrosis secondary to a proximal left ureteral stone currently estimated at 6 x 7 mm. This has minimally migrated distally since recent comparison, seen at the mid L3 vertebral body level. Urinary bladder is unremarkable.    He underwent left ESWL.  His post procedural course was as expected and uneventful.  Today, patient denies any gross hematuria, dysuria or suprapubic/flank pain.  Patient denies any fevers, chills, nausea or vomiting.  He has not passed a fragment.  His UA is negative.    He has a prior history of stones.  Stone composition is unknown.    PMH: Past Medical History:  Diagnosis Date  . Acid reflux   . Hypertension   . Kidney stones   . Lung nodule     Surgical History: Past Surgical History:  Procedure Laterality Date  . EXTRACORPOREAL SHOCK WAVE LITHOTRIPSY Left 05/24/2017   Procedure: EXTRACORPOREAL SHOCK WAVE LITHOTRIPSY (ESWL);  Surgeon: Vanna Scotland, MD;  Location: [facility] ORS;  Service: Urology;  Laterality: Left;    Home Medications:  Allergies as of 06/07/2017   No Known Allergies     Medication List        Accurate as of 06/07/17  9:50 AM. Always use your most recent med  list.          HYDROcodone-acetaminophen 5-325 MG tablet Commonly known as:  NORCO/VICODIN Take 1-2 tablets by mouth every 6 (six) hours as needed for moderate pain.   ondansetron 4 MG disintegrating tablet Commonly known as:  ZOFRAN-ODT Take 1 tablet (4 mg total) by mouth every 8 (eight) hours as needed for nausea or vomiting.   tamsulosin 0.4 MG Caps capsule Commonly known as:  FLOMAX Take 1 capsule (0.4 mg total) by mouth daily.       Allergies: No Known Allergies  Family History: History reviewed. No pertinent family history.  Social History:  reports that he has quit smoking. His smoking use included cigarettes. he has never used smokeless tobacco. He reports that he drinks alcohol. He reports that he does not use drugs.  ROS: UROLOGY Frequent Urination?: No Hard to postpone urination?: No Burning/pain with urination?: No Get up at night to urinate?: No Leakage of urine?: No Urine stream starts and stops?: No Trouble starting stream?: No Do you have to strain to urinate?: No Blood in urine?: No Urinary tract infection?: No Sexually transmitted disease?: No Injury to kidneys or bladder?: No Painful intercourse?: No Weak stream?: No Erection problems?: No Penile pain?: No  Gastrointestinal Nausea?: No Vomiting?: No Indigestion/heartburn?: No Diarrhea?: No Constipation?: No  Constitutional Fever: No Night sweats?: No Weight loss?: No Fatigue?: No  Skin Skin rash/lesions?: No Itching?: No  Eyes Blurred vision?:  No Double vision?: No  Ears/Nose/Throat Sore throat?: No Sinus problems?: No  Hematologic/Lymphatic Swollen glands?: No Easy bruising?: No  Cardiovascular Leg swelling?: No Chest pain?: No  Respiratory Cough?: No Shortness of breath?: No  Endocrine Excessive thirst?: No  Musculoskeletal Back pain?: No Joint pain?: No  Neurological Headaches?: No Dizziness?: No  Psychologic Depression?: No Anxiety?: No  Physical  Exam: BP (!) 150/80   Pulse 69   Resp 16   Ht 6\' 1"  (1.854 m)   Wt 226 lb 6.4 oz (102.7 kg)   SpO2 96%   BMI 29.87 kg/m   Constitutional: Well nourished. Alert and oriented, No acute distress. HEENT: [place] AT, moist mucus membranes. Trachea midline, no masses. Cardiovascular: No clubbing, cyanosis, or edema. Respiratory: Normal respiratory effort, no increased work of breathing. GI: Abdomen is soft, non tender, non distended, no abdominal masses.  GU: No CVA tenderness.  No bladder fullness or masses.   Skin: No rashes, bruises or suspicious lesions. Lymph: No cervical or inguinal adenopathy. Neurologic: Grossly intact, no focal deficits, moving all 4 extremities. Psychiatric: Normal mood and affect.  Laboratory Data: Lab Results  Component Value Date   WBC 12.8 (H) 05/03/2017   HGB 14.7 05/03/2017   HCT 45.8 05/03/2017   MCV 89.1 05/03/2017   PLT 207 05/03/2017    Lab Results  Component Value Date   CREATININE 1.35 (H) 05/03/2017    No results found for: PSA  No results found for: TESTOSTERONE  No results found for: HGBA1C  No results found for: TSH  No results found for: CHOL, HDL, CHOLHDL, VLDL, LDLCALC  Lab Results  Component Value Date   AST 24 04/30/2017   Lab Results  Component Value Date   ALT 23 04/30/2017   No components found for: ALKALINEPHOPHATASE No components found for: BILIRUBINTOTAL  No results found for: ESTRADIOL  Urinalysis Negative.  See Epic.   I have reviewed the labs.   Pertinent Imaging: CLINICAL DATA:  Left-sided kidney stone for which patient underwent lithotripsy. No current symptoms.  EXAM: ABDOMEN - 1 VIEW  COMPARISON:  KUB of May 24, 2017  FINDINGS: No abnormal calcifications project over either kidney. There is a stable phlebolith in the right aspect of the true bony pelvis. No ureteral or bladder stones are observed. The bowel gas pattern is unremarkable. The bony structures are  normal.  IMPRESSION: No calcified urinary tract stones are observed. There is no acute intra-abdominal abnormality.   Electronically Signed   By: David  SwazilandJordan M.D.   On: 06/07/2017 09:08  I have independently reviewed the films.    Assessment & Plan:    1. Left ureteral stone  -s/p Left ESWL  - no stone seen on KUB  2. Left hydronephrosis  - A RUS will be obtained at this time  3. Microscopic hematuria  - UA is negative for hematuria  Return for RUS report .  These notes generated with voice recognition software. I apologize for typographical errors.  Michiel CowboySHANNON Caster Fayette, PA-C  [facility] [address], [address] [place], [place] [address] [phone]

## 2017-06-29 ENCOUNTER — Ambulatory Visit: Payer: Self-pay

## 2017-07-04 ENCOUNTER — Ambulatory Visit: Payer: Self-pay

## 2017-07-05 ENCOUNTER — Ambulatory Visit: Payer: PRIVATE HEALTH INSURANCE | Admitting: Urology

## 2017-07-11 ENCOUNTER — Ambulatory Visit: Payer: Self-pay

## 2017-07-11 NOTE — Progress Notes (Deleted)
07/12/2017 3:35 PM   Dawna Part [birth year] [id number]  Referring provider: No referring provider defined for this encounter.  No chief complaint on file.   HPI: 52 yo WM with a history of a left ureteral stone and bilateral nephrolithiasis who is s/p left ESWL on 05/24/2016 who presents today for RUS results.  Background history Patient is a 52 year old Caucasian male who presents today for a left ureteral stone.  He has been seen in the ED on 04/30/2017 and 05/03/2017 for this stone.  Creatinine at 1.35 up from baseline of 0.90.   WBC count of 12.8.   UA + for TNTC RBC's on 05/03/2017.    Most recent CT Renal stone study performed on 05/03/2017 noted normal bilateral adrenal glands. Tiny bilateral nonobstructing lower pole renal calculi. Mild left-sided hydroureteronephrosis secondary to a proximal left ureteral stone currently estimated at 6 x 7 mm. This has minimally migrated distally since recent comparison, seen at the mid L3 vertebral body level. Urinary bladder is unremarkable.    He underwent left ESWL.  His post procedural course was as expected and uneventful.  Today, patient denies any gross hematuria, dysuria or suprapubic/flank pain.  Patient denies any fevers, chills, nausea or vomiting.  He has not passed a fragment.  His UA is negative.    He has a prior history of stones.  Stone composition is unknown.    PMH: Past Medical History:  Diagnosis Date  . Acid reflux   . Hypertension   . Kidney stones   . Lung nodule     Surgical History: Past Surgical History:  Procedure Laterality Date  . EXTRACORPOREAL SHOCK WAVE LITHOTRIPSY Left 05/24/2017   Procedure: EXTRACORPOREAL SHOCK WAVE LITHOTRIPSY (ESWL);  Surgeon: Vanna Scotland, MD;  Location: [facility] ORS;  Service: Urology;  Laterality: Left;    Home Medications:  Allergies as of 07/12/2017   No Known Allergies     Medication List        Accurate as of 07/11/17  3:35 PM. Always use your most recent med  list.          HYDROcodone-acetaminophen 5-325 MG tablet Commonly known as:  NORCO/VICODIN Take 1-2 tablets by mouth every 6 (six) hours as needed for moderate pain.   ondansetron 4 MG disintegrating tablet Commonly known as:  ZOFRAN-ODT Take 1 tablet (4 mg total) by mouth every 8 (eight) hours as needed for nausea or vomiting.   tamsulosin 0.4 MG Caps capsule Commonly known as:  FLOMAX Take 1 capsule (0.4 mg total) by mouth daily.       Allergies: No Known Allergies  Family History: No family history on file.  Social History:  reports that he has quit smoking. His smoking use included cigarettes. He has never used smokeless tobacco. He reports that he drinks alcohol. He reports that he does not use drugs.  ROS:                                        Physical Exam: There were no vitals taken for this visit.  Constitutional: Well nourished. Alert and oriented, No acute distress. HEENT: [place] AT, moist mucus membranes. Trachea midline, no masses. Cardiovascular: No clubbing, cyanosis, or edema. Respiratory: Normal respiratory effort, no increased work of breathing. GI: Abdomen is soft, non tender, non distended, no abdominal masses.  GU: No CVA tenderness.  No bladder fullness or masses.  Skin: No rashes, bruises or suspicious lesions. Lymph: No cervical or inguinal adenopathy. Neurologic: Grossly intact, no focal deficits, moving all 4 extremities. Psychiatric: Normal mood and affect.  Laboratory Data: Lab Results  Component Value Date   WBC 12.8 (H) 05/03/2017   HGB 14.7 05/03/2017   HCT 45.8 05/03/2017   MCV 89.1 05/03/2017   PLT 207 05/03/2017    Lab Results  Component Value Date   CREATININE 1.35 (H) 05/03/2017    No results found for: PSA  No results found for: TESTOSTERONE  No results found for: HGBA1C  No results found for: TSH  No results found for: CHOL, HDL, CHOLHDL, VLDL, LDLCALC  Lab Results  Component Value Date     AST 24 04/30/2017   Lab Results  Component Value Date   ALT 23 04/30/2017   No components found for: ALKALINEPHOPHATASE No components found for: BILIRUBINTOTAL  No results found for: ESTRADIOL  Urinalysis Negative.  See Epic.   I have reviewed the labs.   Pertinent Imaging: CLINICAL DATA:  Left-sided kidney stone for which patient underwent lithotripsy. No current symptoms.  EXAM: ABDOMEN - 1 VIEW  COMPARISON:  KUB of May 24, 2017  FINDINGS: No abnormal calcifications project over either kidney. There is a stable phlebolith in the right aspect of the true bony pelvis. No ureteral or bladder stones are observed. The bowel gas pattern is unremarkable. The bony structures are normal.  IMPRESSION: No calcified urinary tract stones are observed. There is no acute intra-abdominal abnormality.   Electronically Signed   By: David  SwazilandJordan M.D.   On: 06/07/2017 09:08  I have independently reviewed the films.    Assessment & Plan:    1. Left ureteral stone  -s/p Left ESWL  - no stone seen on KUB  2. Left hydronephrosis  - A RUS will be obtained at this time  3. Microscopic hematuria  - UA is negative for hematuria  No follow-ups on file.  These notes generated with voice recognition software. I apologize for typographical errors.  Michiel CowboySHANNON Kenneith Stief, PA-C  [facility] [address], [address] [place], [place] [address] [phone]

## 2017-07-12 ENCOUNTER — Ambulatory Visit: Payer: Self-pay | Admitting: Urology

## 2017-07-16 ENCOUNTER — Encounter: Payer: Self-pay | Admitting: Urology

## 2017-07-23 ENCOUNTER — Ambulatory Visit
Admission: RE | Admit: 2017-07-23 | Discharge: 2017-07-23 | Disposition: A | Discharge status: Home / Self Care | Payer: Self-pay | Source: Ambulatory Visit | Attending: Urology | Admitting: Urology

## 2017-07-23 ENCOUNTER — Telehealth: Payer: Self-pay | Admitting: Urology

## 2017-07-23 DIAGNOSIS — N200 Calculus of kidney: Secondary | ICD-10-CM | POA: Insufficient documentation

## 2017-07-23 DIAGNOSIS — R3129 Other microscopic hematuria: Secondary | ICD-10-CM | POA: Insufficient documentation

## 2017-07-23 NOTE — Telephone Encounter (Signed)
Pt is going to U/S today.  He is scheduled for U/S results tomorrow.  I was wondering if maybe we could give results over the phone.  Please advise.

## 2017-07-24 ENCOUNTER — Ambulatory Visit: Payer: No Typology Code available for payment source | Admitting: Urology

## 2017-07-24 NOTE — Telephone Encounter (Signed)
Patient notified and voiced understanding. He canceled appointment

## 2017-07-24 NOTE — Telephone Encounter (Signed)
Please let Mr. Cory Gardner know that his ultrasound did show that his kidneys are not swollen.  He does have a small stone in his left kidney.  I would suggest that he have an X-ray on a yearly basis to monitor that stone.  We can also mail him a copy of the report.

## 2018-09-26 IMAGING — CT CT HEAD W/O CM
3 series · 15 of 47 positions shown, 18 images · non-contrast
Comparison: None

CLINICAL DATA: Dehydration, over heated from temperature and
humidity, weakness, dizziness this morning, numbness and tingling,
paresthesias, former smoker

EXAM:
CT HEAD WITHOUT CONTRAST
TECHNIQUE: Contiguous axial images were obtained from the base of the skull
through the vertex without intravenous contrast. Sagittal and
coronal MPR images reconstructed from axial data set.

[Series 3: coronal soft tissue · coronal · 0.30mm/px · 3 of 65 slices shown]
[im 22/65  brain]
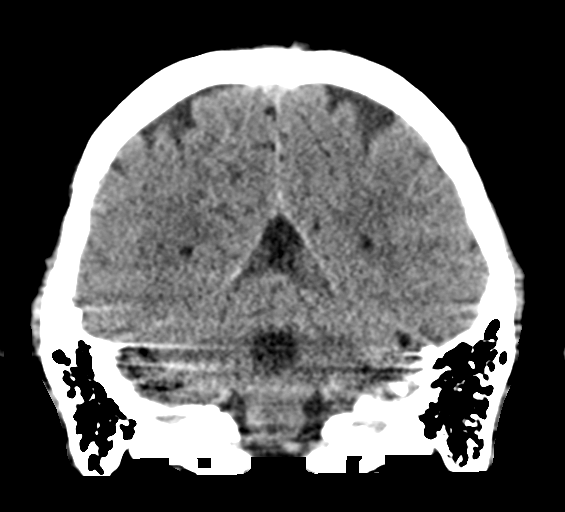
[im 29/65  brain]
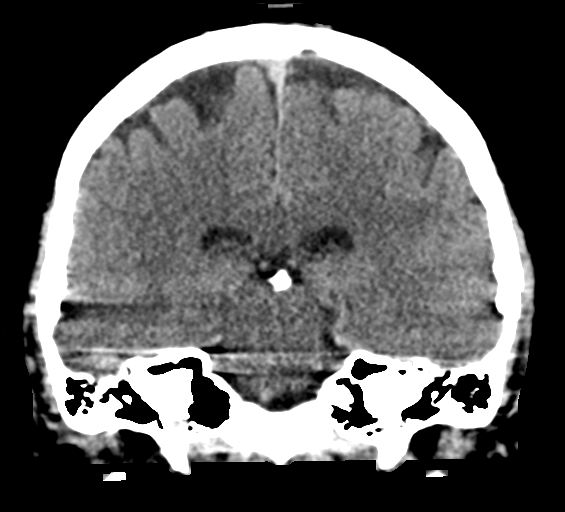
[im 36/65  brain]
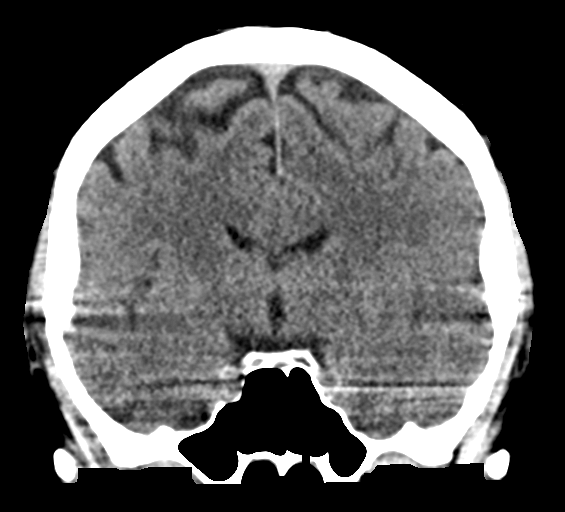

[Series 4: sagittal soft tissue · sagittal · 0.30mm/px · 3 of 59 slices shown]
[im 20/59  brain]
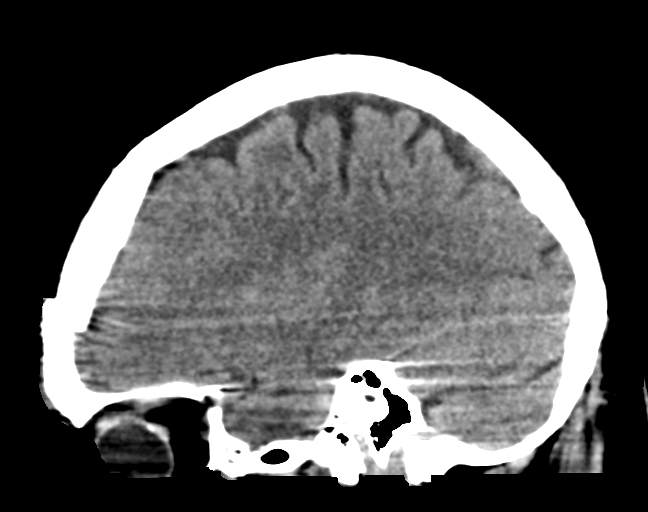
[im 30/59  brain]
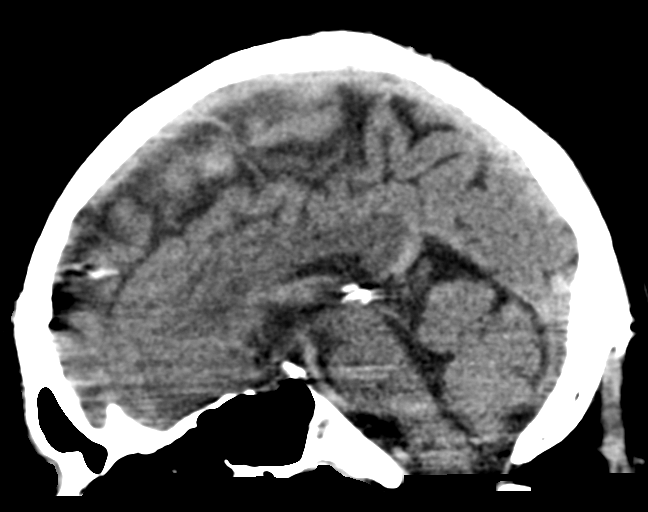
[im 39/59  brain]
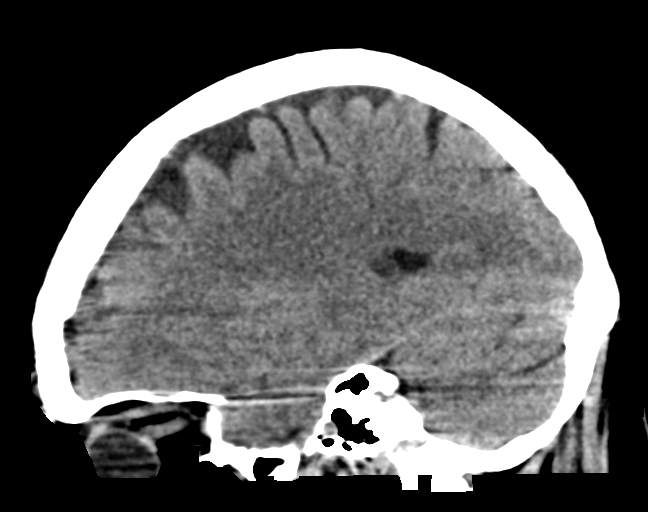

[Series 5: head wo · axial · 0.40mm/px · z∈[+277,+402]mm · 9 of 30 slices shown, 12 images]
[im 3/30  brain]
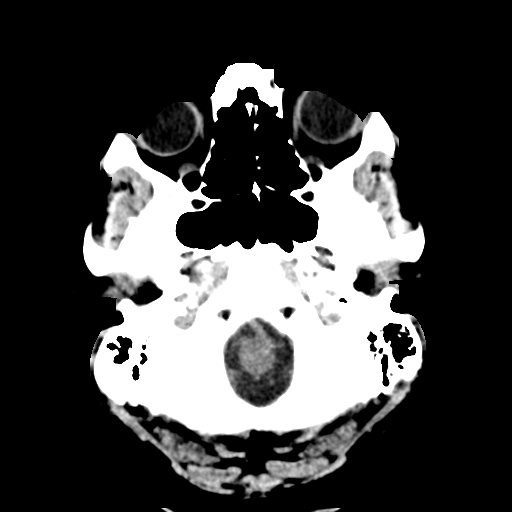
[im 3/30  bone]
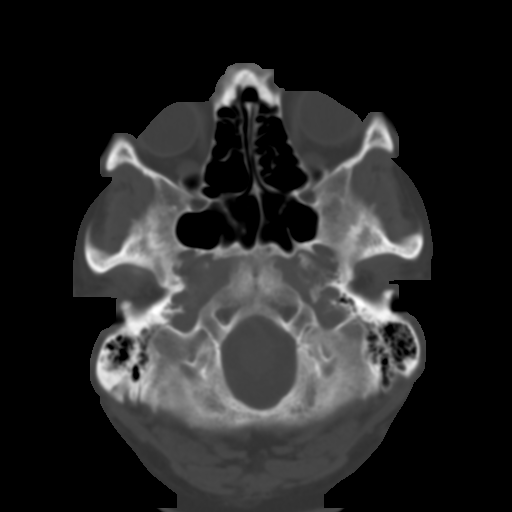
[im 6/30  brain]
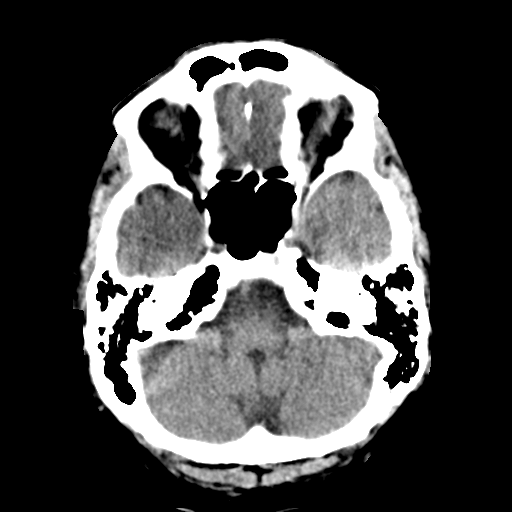
[im 9/30  brain]
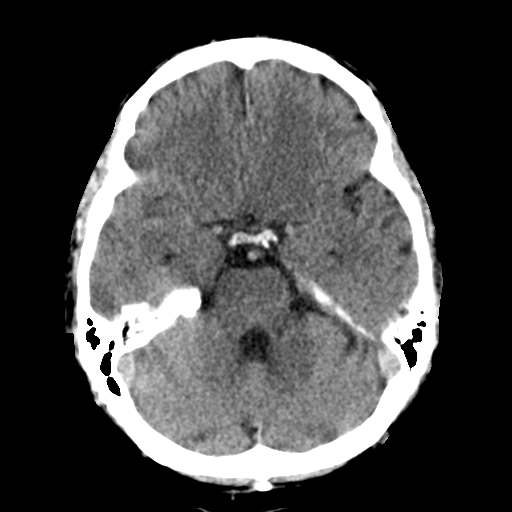
[im 12/30  brain]
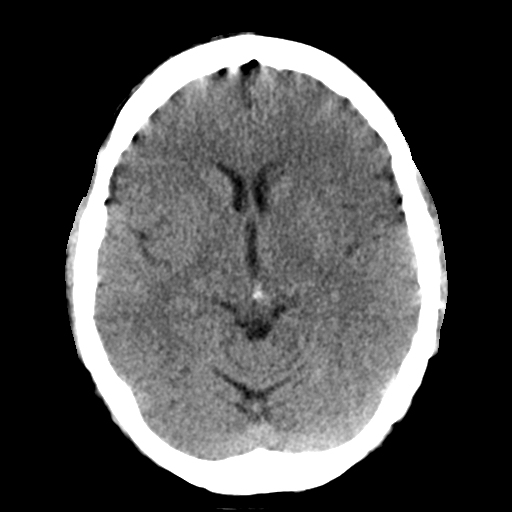
[im 16/30  brain]
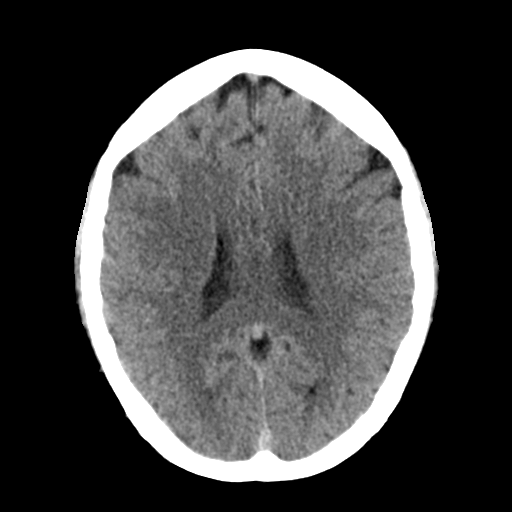
[im 16/30  bone]
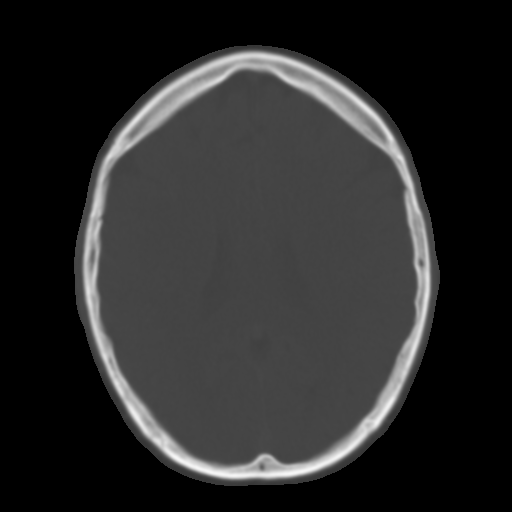
[im 19/30  brain]
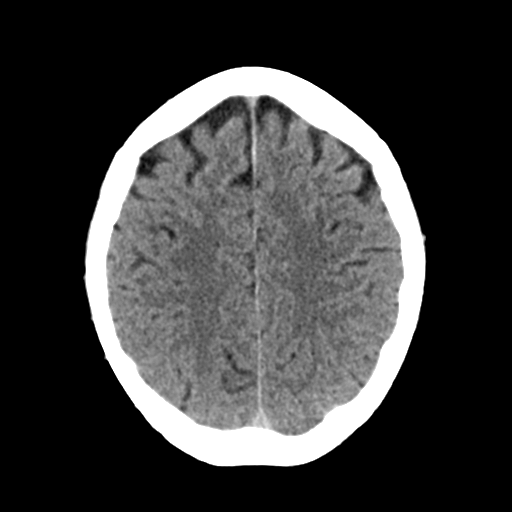
[im 22/30  brain]
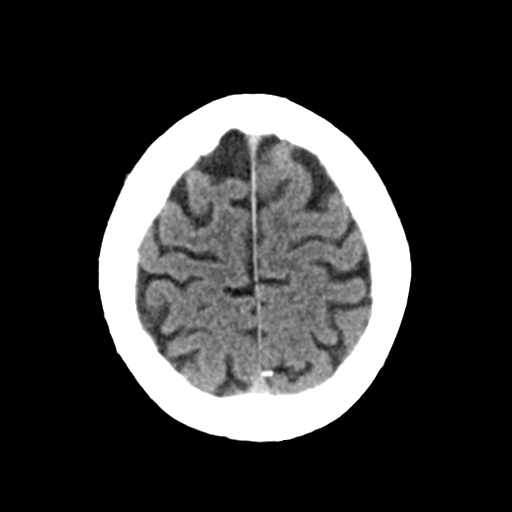
[im 25/30  brain]
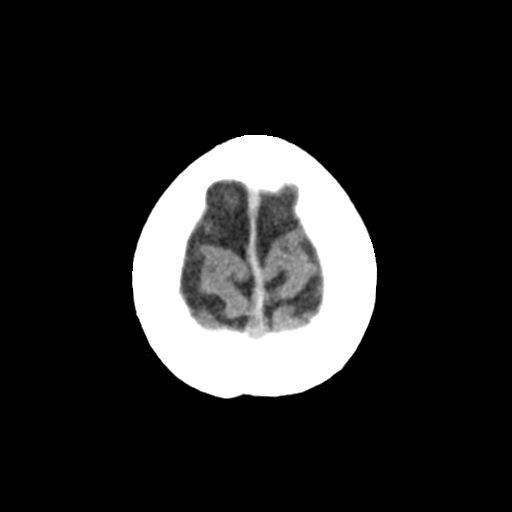
[im 28/30  brain]
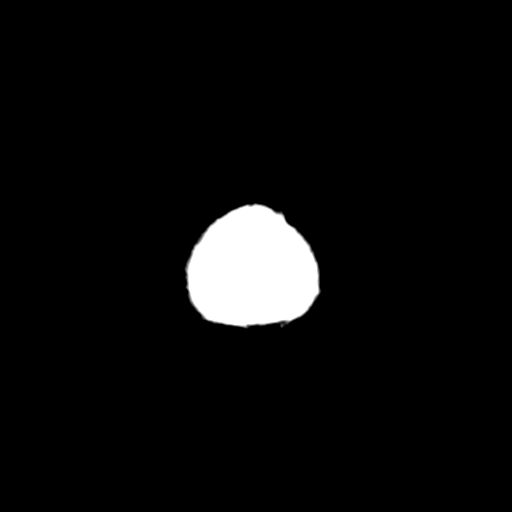
[im 28/30  bone]
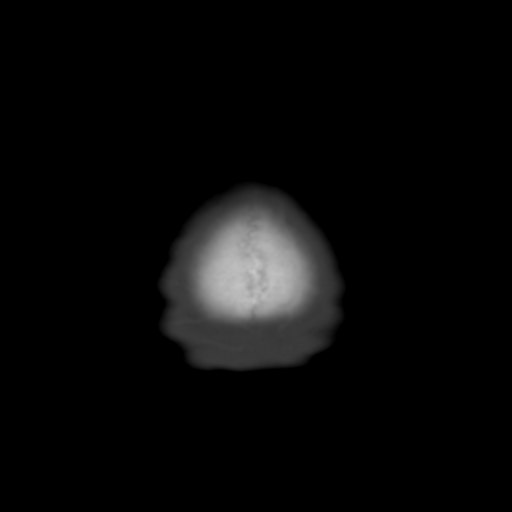

[15 of 47 positions shown; findings below may reference images not displayed]

FINDINGS: Brain: Few scattered motion artifacts, especially traversing
temporal lobes. Normal ventricular morphology. No midline shift or
mass effect. Otherwise grossly normal appearance of brain
parenchyma. No intracranial hemorrhage, mass lesion, or evidence of
acute infarction. No definite extra-axial fluid collections.

Vascular: Unremarkable

Skull: Within limits of motion, grossly normal appearance

Sinuses/Orbits: Clear

Other: N/A
IMPRESSION: No definite intracranial abnormalities identified on exam mildly
limited by patient motion as above.
